# Patient Record
Sex: Female | Born: 1980 | Race: Black or African American | Hispanic: No | Marital: Married | Smoking: Never smoker
Health system: Southern US, Community
[De-identification: ages and names within clinical notes are randomized; demographics above are authoritative.]

## PROBLEM LIST (undated history)

## (undated) DIAGNOSIS — O24419 Gestational diabetes mellitus in pregnancy, unspecified control: Secondary | ICD-10-CM

## (undated) HISTORY — PX: ECTOPIC PREGNANCY SURGERY: SHX613

---

## 2015-09-07 HISTORY — PX: CERVICAL CERCLAGE: SHX1329

## 2015-09-07 NOTE — L&D Delivery Note (Addendum)
Delivery Note   I was called stat to the room that the patient had just delivered.  Upon my arrival RN reported that patient had gone to the bathroom, had had a bowel movement and soon after that fetus delivered.  RN rushed into bathroom to find fetus delivering in breech presentation with buttocks and trunk delivering and soon after extremities and head delivered.  RN reports that she supported the baby as it delivered, patient then got back to bed where cord was clamped and cut.   I arrived to find patient already on the bed, cord clamped and cut and pediatrics team attempting resuscitation.  Pediatric team reported that baby's airway was too small and they could not place any tubing down throat, baby did not respond to CPAP and therefore resuscitation efforts were stopped.    I delivered placenta, which appeared grossly normal and intact.  I then [facility] the [facility], no cervical trauma was noted.   Chaplain was present in room and offered prayers and support to patient and her family.        Baby delivered at 4:15 PM  APGAR: 1, 1; weight 1 lb 3 oz (539 g).  Baby expired after about 1 hr 50 minutes of life. Baby appeared with grossly normal external features.      Placenta status: Intact, Spontaneous.    Anesthesia: None  Episiotomy: None Lacerations: None Est. Blood Loss (mL): 100  Mom to postpartum.  [facility] [place]  [facility] 15-Nov-2015, 10:07 PM

## 2015-10-03 LAB — OB RESULTS CONSOLE RUBELLA ANTIBODY, IGM: Rubella: IMMUNE

## 2015-10-03 LAB — OB RESULTS CONSOLE HEPATITIS B SURFACE ANTIGEN: Hepatitis B Surface Ag: NEGATIVE

## 2015-10-03 LAB — OB RESULTS CONSOLE RPR: RPR: NONREACTIVE

## 2015-10-03 LAB — OB RESULTS CONSOLE HIV ANTIBODY (ROUTINE TESTING): HIV: NONREACTIVE

## 2015-12-22 ENCOUNTER — Inpatient Hospital Stay (HOSPITAL_COMMUNITY): Admission: AD | Admit: 2015-12-22 | Payer: Self-pay | Admitting: Obstetrics and Gynecology

## 2015-12-22 ENCOUNTER — Encounter (HOSPITAL_COMMUNITY): Payer: Self-pay | Admitting: *Deleted

## 2015-12-22 ENCOUNTER — Inpatient Hospital Stay (HOSPITAL_COMMUNITY)
Admission: AD | Admit: 2015-12-22 | Discharge: 2015-12-23 | DRG: 782 | Disposition: A | Discharge status: Other Acute Inpatient Hospital | Payer: Medicaid Other | Source: Ambulatory Visit | Attending: Obstetrics and Gynecology | Admitting: Obstetrics and Gynecology

## 2015-12-22 ENCOUNTER — Inpatient Hospital Stay (HOSPITAL_COMMUNITY): Payer: Medicaid Other

## 2015-12-22 DIAGNOSIS — O26879 Cervical shortening, unspecified trimester: Secondary | ICD-10-CM

## 2015-12-22 DIAGNOSIS — D563 Thalassemia minor: Secondary | ICD-10-CM | POA: Diagnosis present

## 2015-12-22 DIAGNOSIS — Z3A20 20 weeks gestation of pregnancy: Secondary | ICD-10-CM

## 2015-12-22 DIAGNOSIS — N883 Incompetence of cervix uteri: Secondary | ICD-10-CM | POA: Diagnosis present

## 2015-12-22 DIAGNOSIS — O3432 Maternal care for cervical incompetence, second trimester: Secondary | ICD-10-CM | POA: Diagnosis present

## 2015-12-22 LAB — CBC
HEMATOCRIT: 27.3 % — AB (ref 36.0–46.0)
Hemoglobin: 9.1 g/dL — ABNORMAL LOW (ref 12.0–15.0)
MCH: 22.2 pg — ABNORMAL LOW (ref 26.0–34.0)
MCHC: 33.3 g/dL (ref 30.0–36.0)
MCV: 66.7 fL — AB (ref 78.0–100.0)
PLATELETS: 199 10*3/uL (ref 150–400)
RBC: 4.09 MIL/uL (ref 3.87–5.11)
RDW: 16.3 % — AB (ref 11.5–15.5)
WBC: 9.9 10*3/uL (ref 4.0–10.5)

## 2015-12-22 LAB — ABO/RH: ABO/RH(D): B POS

## 2015-12-22 LAB — TYPE AND SCREEN
ABO/RH(D): B POS
ANTIBODY SCREEN: NEGATIVE

## 2015-12-22 MED ORDER — ACETAMINOPHEN 325 MG PO TABS: 650.0000 mg | ORAL_TABLET | ORAL | Status: DC | PRN

## 2015-12-22 MED ORDER — DOCUSATE SODIUM 100 MG PO CAPS: 100.0000 mg | ORAL_CAPSULE | Freq: Every day | ORAL | Status: DC

## 2015-12-22 MED ORDER — ZOLPIDEM TARTRATE 5 MG PO TABS: 5.0000 mg | ORAL_TABLET | Freq: Every evening | ORAL | Status: DC | PRN

## 2015-12-22 MED ORDER — PRENATAL MULTIVITAMIN CH: 1.0000 | ORAL_TABLET | Freq: Every day | ORAL | Status: DC

## 2015-12-22 MED ORDER — DEXTROSE IN LACTATED RINGERS 5 % IV SOLN
INTRAVENOUS | Status: DC
  Administered 2015-12-22 – 2015-12-23 (×2): via INTRAVENOUS

## 2015-12-22 MED ORDER — CALCIUM CARBONATE ANTACID 500 MG PO CHEW: 2.0000 | CHEWABLE_TABLET | ORAL | Status: DC | PRN

## 2015-12-22 NOTE — H&P (Signed)
Heather Little is a 35 y.o. female, G1 P0 at 20.1 weeks presents from the office per Dr Normand Sloopillard with cervical incompetence   Patient Active Problem List   Diagnosis Date Noted  . Cervical incompetence 12/22/2015    Pregnancy Course: Patient entered care at  8.5 weeks.   EDC of 05/09/16 established by LMP.      US evaluations:  13.2 weeks - 1st screen: Singleton pregnancy. IUP with cardiac activity seen. CRL measures 13w 2d. NT measures 1.384mm. NB seen. Rt ovary CL cyst/adnexas unremarkable. Lt ovary/adnexa unremarkable.  20.1 weeks - Anatomy: Singleton Pregnancy , Vertex Presentation, Anterior placenta- Placental edge is 5.5cm from internal OS-Normal. Fluid is Normal- vertical Pocket= 4.4cm. Ovaries and Adnexas appears within normal limits. Cervix in Funneled to the external os- there appears to be minimal cervix=0.18cm Profile, nasal bone, philtrum, palate, feet,orbits, open hands 5th digit, heels seen      Significant prenatal events:   beta thalassemia trait,  Cervix 1.8cm bulging Last evaluation:   20.1 weeks   VE:n/a  Reason for admission:  BULGING MEMBRANES  Pt States:   Contractions Frequency: none         Contraction severity: n/a         Fetal activity: +fm  OB History    Gravida Para Term Preterm AB TAB SAB Ectopic Multiple Living   5 1 1  0 3 2 0 1 0 1     History reviewed. No pertinent past medical history. History reviewed. No pertinent past surgical history. Family History: family history is not on file. Social History:  reports that she has never smoked. She does not have any smokeless tobacco history on file. Her alcohol and drug histories are not on file.   Prenatal Transfer Tool  Maternal Diabetes: No Genetic Screening: Normal Maternal Ultrasounds/Referrals: Abnormal:  Findings:   Other: bulging membrane Fetal Ultrasounds or other Referrals:  None, Referred to Materal Fetal Medicine  Maternal Substance Abuse:  No Significant Maternal Medications:   None Significant Maternal Lab Results: None   ROS:  See HPI above, all other systems are negative  No Known Allergies    Blood pressure 99/62, pulse 78, temperature 98.1 F (36.7 C), temperature source Oral, resp. rate 18, height 5\' 4"  (1.626 m), weight 173 lb (78.472 kg).  Maternal Exam:  Uterine Assessment: Contraction frequency is rare.  Abdomen: Gravid, non tender. Fundal height is aga.  Normal external genitalia, vulva, cervix, uterus and adnexa.  No lesions noted on exam.  Pelvis adequate for delivery.  Fetal presentation: Vertex by Leopold's   Fetal Exam:  Monitor Surveillance : NST q8 Mode: Ultrasound.  FHR 150 CTXs:none    Physical Exam: Nursing note and vitals reviewed General: alert and cooperative She appears well nourished Psychiatric: Normal mood and affect. Her behavior is normal Head: Normocephalic Eyes: Pupils are equal, round, and reactive to light Neck: Normal range of motion Cardiovascular: RRR without murmur  Respiratory: CTAB. Effort normal  Abd: soft, non-tender, +BS, no rebound, no guarding  Genitourinary: Vagina normal  Neurological: A&Ox3 Skin: Warm and dry  Musculoskeletal: Normal range of motion  Homan's sign negative bilaterally No evidence of DVTs.  Edema: no edema DTR: 2+ Clonus: None   Prenatal labs: ABO, Rh: --/--/B POS, B POS (04/17 1205) Antibody: NEG (04/17 1205) Rubella:  immune RPR:   NR HBsAg:   negative HIV:   NR GBS:  pending Sickle cell/Hgb electrophoresis:  WNL Pap:  ATYPICAL SQUAMOUS CELLS OF UNDETERMINED, 10/15/15 GC: negtive  Chlamydia: negative Genetic screenings:   wnl Glucola:  unknown  Assessment:  IUP at 20.1 weeks NICHD: Category Membranes: BBW GBS pending Diagnosis: incompetent cervix   Plan:  Admit to Antepartum unit Routing [facility] AP orders MFM consult in the morning T[place]elen[place]    Phoua Hoadley, CNM, MSN 12/22/2015, 5:33 PM

## 2015-12-22 NOTE — Progress Notes (Signed)
Patient ID: Benard Rinkenneh Oesterling, female   DOB: [birth year], 35 y.o.   MRN: [id number] Pt without complaints.  No leakage of fluid or VB.  Good FM  BP 110/54 mmHg  Pulse 72  Temp(Src) 98.2 F (36.8 C) (Oral)  Resp 20  Ht 5\' 4"  (1.626 m)  Wt 173 lb (78.472 kg)  BMI 29.68 kg/m2  FHTS Baseline: 156 bpm  Toco none  Pt in NAD CV RRR Lungs CTAB abd  Gravid soft and NT GU no vb.  SSE bulging membranes.   EXt no calf tenderness Results for orders placed or performed during the hospital encounter of 12/22/15 (from the past 72 hour(s))  Type and screen [facility]     Status: None   Collection Time: 12/22/15 12:05 PM  Result Value Ref Range   ABO/RH(D) B POS    Antibody Screen NEG    Sample Expiration 12/25/2015   ABO/Rh     Status: None   Collection Time: 12/22/15 12:05 PM  Result Value Ref Range   ABO/RH(D) B POS     Assessment and Plan 7024w1d  Advanced cervical dilation Admit and place in [place] MFM consult in am to see if she may be a candidate for emergency cerclage GBS done today in the office Check cbc Pt voiced understanding NPO at midnight

## 2015-12-22 NOTE — MAU Note (Signed)
Pt sent from [facility] for short cervix.  Denies pain, bleeding or discharge.

## 2015-12-23 ENCOUNTER — Inpatient Hospital Stay (HOSPITAL_COMMUNITY): Payer: Medicaid Other

## 2015-12-23 ENCOUNTER — Ambulatory Visit (HOSPITAL_COMMUNITY)
Admit: 2015-12-23 | Discharge: 2015-12-23 | Disposition: A | Discharge status: Home / Self Care | Payer: Medicaid Other | Attending: Certified Nurse Midwife | Admitting: Certified Nurse Midwife

## 2015-12-23 DIAGNOSIS — O3432 Maternal care for cervical incompetence, second trimester: Secondary | ICD-10-CM | POA: Insufficient documentation

## 2015-12-23 DIAGNOSIS — Z3A20 20 weeks gestation of pregnancy: Secondary | ICD-10-CM

## 2015-12-23 MED ORDER — INDOMETHACIN 50 MG PO CAPS
50.0000 mg | ORAL_CAPSULE | Freq: Once | ORAL | Status: AC
  Administered 2015-12-23: 50 mg via ORAL
  Filled 2015-12-23: qty 1

## 2015-12-23 MED ORDER — AZITHROMYCIN 500 MG PO TABS
1000.0000 mg | ORAL_TABLET | Freq: Once | ORAL | Status: AC
  Administered 2015-12-23: 1000 mg via ORAL
  Filled 2015-12-23: qty 2

## 2015-12-23 NOTE — Progress Notes (Addendum)
Called [facility].

## 2015-12-23 NOTE — Progress Notes (Addendum)
Report called to Rosaura CarpenterMissy Halliday, RN @[facility] , 4th floor, L&D for pt transfer.

## 2015-12-23 NOTE — Progress Notes (Signed)
Hospital day # 1 pregnancy at 2397w2d--cervical incompetence    S:  Perception of contractions: none      Vaginal bleeding: none now       Vaginal discharge:  no significant change  O: BP 94/80 mmHg  Pulse 73  Temp(Src) 98.2 F (36.8 C) (Oral)  Resp 16  Ht 5\' 4"  (1.626 m)  Wt 173 lb (78.472 kg)  BMI 29.68 kg/m2  SpO2 100%      Fetal tracings:158 via doppler      Contractions:  none       Uterus gravid and non-tender      Extremities: no significant edema and no signs of DVT          Labs:   Results for orders placed or performed during the hospital encounter of 12/22/15 (from the past 24 hour(s))  Type and screen [facility]     Status: None   Collection Time: 12/22/15 12:05 PM  Result Value Ref Range   ABO/RH(D) B POS    Antibody Screen NEG    Sample Expiration 12/25/2015   ABO/Rh     Status: None   Collection Time: 12/22/15 12:05 PM  Result Value Ref Range   ABO/RH(D) B POS   CBC     Status: Abnormal   Collection Time: 12/22/15  2:57 PM  Result Value Ref Range   WBC 9.9 4.0 - 10.5 K/uL   RBC 4.09 3.87 - 5.11 MIL/uL   Hemoglobin 9.1 (L) 12.0 - 15.0 g/dL   HCT 40.927.3 (L) 81.136.0 - 91.446.0 %   MCV 66.7 (L) 78.0 - 100.0 fL   MCH 22.2 (L) 26.0 - 34.0 pg   MCHC 33.3 30.0 - 36.0 g/dL   RDW 78.216.3 (H) 95.611.5 - 21.315.5 %   Platelets 199 150 - 400 K/uL         Meds:  Current facility-administered medications:  .  acetaminophen (TYLENOL) tablet 650 mg, 650 mg, Oral, Q4H PRN, Khadeem Rockett, CNM .  calcium carbonate (TUMS - dosed in mg elemental calcium) chewable tablet 400 mg of elemental calcium, 2 tablet, Oral, Q4H PRN, Analina Filla, CNM .  dextrose 5 % in lactated ringers infusion, , Intravenous, Continuous, Naima Dillard, MD, Last Rate: 125 mL/hr at 12/23/15 0800 .  docusate sodium (COLACE) capsule 100 mg, 100 mg, Oral, Daily, Katrisha Segall, CNM .  prenatal multivitamin tablet 1 tablet, 1 tablet, Oral, Q1200, Dinorah Masullo, CNM .  zolpidem (AMBIEN) tablet 5 mg, 5  mg, Oral, QHS PRN, Jkwon Treptow, CNM   A: 6697w2d with cervical incompetence     stable     Fetal tracings: appropriate for GA     Contractions: none     Uterus non-tender      Extremities: DTR 1+, no clonus, no edema     trendelnburg     Advanced cervical dilation    GBS done today in the office  P: Continue current plan of care      Upcoming tests/treatments:    MFM consult in am to see if she may be a candidate for emergency cerclage  NPO until MFM consult/cerclage placement      MDs will follow    Kentrel Clevenger, CNM, MSN 12/23/2015. 8:36 AM

## 2015-12-23 NOTE — Progress Notes (Signed)
Dr Estanislado Pandyivard & Dr Marjo Bickerenney notified of [facility] in transit [facility].

## 2015-12-23 NOTE — Consult Note (Signed)
MFM Staff Consult Note:  I reviewed with the patient the pathophysiology of preterm delivery in pregnancy and that often the etiology of the preterm delivery is not ascertained from the clinical scenario. In this patient's case, she has not had a history of preterm delivery or second trimester loss.  However, she is 20 weeks 2 days and has a dilated cervix with bulging membranes.    By my exam her cervix is almost 2 cm visually dilated and 2-3/70/high with a ballotable fetal foot.  She is not contracting, has no fundal tenderness, no foul smelling discharge, has intact membranes and is afebrile.  Clearly, this pregnancy is complicated by apparently insufficiency in her cervix to continue to viable gestational age.  Hence, my impression is that of cervical insufficiency.   Advanced cervical dilation can also lead to infection; therefore, there are 2 mechanisms that could be contributing to the preterm delivery.  I did remind the patient that there is no treatment for preterm labor at this early gestational age, but there are appropriate medical interventions to increase the success rate of rescue cerclage.  Consistent with recently published protocols (eg, Berghella et al), I recommend initiation of indomethacin x 48 hours along with latency antibiotics x 48 hours to most fully reduce risk of contraction onset and membrane rupture.  Rescue cerclage for a woman without a history of preterm birth with sonographic demonstration of dilated cervix (no measurable functional cervical length but less than 3cm dilated) along with digital examination demonstrating 2-3 cm of dilation of the internal os in this current pregnancy is justified by current literature. There is evidence that these women may gain an additional 6 weeks of gestation as opposed to no rescue cerclage.    I explained risks of pprom, bleeding, infection, injury to surrounding structures, and spontaneous preterm delivery in spite of the attempt.   She understands a 3-5% risk of infection and/or pprom in setting of cerclage placed as a rescue.  Given no evidence of infection or PTL has been seen over an extended observation period thusfar, she is an excellent candidate for rescue cerclage.  She desired the procedure, understood the rationale behind the approach, and desired [facility] to facilitate the needed course of action under our service for the duration of the surgery and 48 hour observation period.  Summary: I explained to the patient my diagnosis of cervical insufficiency as based upon ultrasound and examination. She understands the impression and implications with risk of previable pregnancy loss. I then discussed available treatments including vaginal progesterone and cerclage. The patient understood purpose, expected outcomes, risks, and benefits germane to each. She decided she was interested in cerclage and 48 hour Indocin/latency antibiotics under our service.  Recommendations: 1. I spoke with Drs. Rivard and Vincenza HewsQuinn who agree 2. maintain uterine monitoring [facility]. [facility] 4. Indocin 50mg  load then 25 mg po q6 x 48 hours (start now)  5. Begin Ampicillin/azithromycin for latency antibiotics x 48 hours  6. Following cerclage I recommend a TVUS for "stitch check" and cervical length in 1 week and then every 2 weeks there after until 28 weeks 7. Following cerclage, prometrium 200mg  pv qhs until 36 weeks 8. Cerclage removal if evidence infection, labor or achievement of 36 weeks (whichever comes first)   Time Spent: I spent in excess of 60 minutes in consultation with this patient to review records, evaluate her case, and provide her with an  adequate discussion and education. More than 50% of this time was spent in direct face-to-face counseling.   It was a pleasure seeing your patient in the hospital today. Thank you for consultation. Please do  not hesitate to contact our service for any further questions.  Thank you,  Denney, Louann Sjogren, MD, MS, FACOG Assistant Professor Section of Maternal-Fetal Medicine [facility]

## 2015-12-23 NOTE — Progress Notes (Addendum)
Heather CarpenterMissy Halliday, RN updated on new oral meds given: Indocin 50mg  PO & Zithromax 1000mg  PO.

## 2015-12-23 NOTE — Discharge Summary (Signed)
Transfer of Care   Transfer of Care to MFM  Patient ID: Heather Little MRN: [id number] DOB/AGE: 34-Jan-1982 34 y.o.  Admit date: 12/22/2015 Discharge date: 12/23/2015  Admission Diagnoses: cervical incompentence  Discharge Diagnoses:  Active Problems:   Cervical incompetence   Discharged Condition: good  Hospital Course: BMZ  PreNatal Labs ABO, Rh: --/--/B POS, B POS (04/17 1205)   Antibody: NEG (04/17 1205) RPR:   NR HBsAg:   negative HIV:   NR GBS:  pending Sickle cell/Hgb electrophoresis: WNL Pap: ATYPICAL SQUAMOUS CELLS OF UNDETERMINED, 10/15/15 GC: negtive  Chlamydia: negative Genetic screenings: wnl Glucola: unknown    Consults: MFM  Significant Diagnostic Studies: US for cervical length  Treatments: [place]   Discharge Exam: Blood pressure 94/80, pulse 73, temperature 98.2 F (36.8 C), temperature source Oral, resp. rate 16, height 5\' 4"  (1.626 m), weight 173 lb (78.472 kg), SpO2 100 %. General appearance: alert, cooperative and appears stated age  Disposition: transfer of care to MFM for cerclage placement     Medication List    ASK your doctor about these medications        prenatal multivitamin Tabs tablet  Take 1 tablet by mouth daily at 12 noon.      azithromycin 1g PO now then Amp 2g IV q6 Indomethacin 50mg  PO now then 25mg  PO q6   Signed: Jace Dowe, CNM, MSN 12/23/2015, 9:14 AM

## 2015-12-23 NOTE — Progress Notes (Addendum)
Report given by phone to Valene BorsJosh Rife, paramedic with [facility]

## 2015-12-23 NOTE — Addendum Note (Signed)
Encounter addended by: Durwin NoraJeffrey M Nayana Lenig, MD on: 12/23/2015 11:42 AM<BR>     Documentation filed: Charges VN

## 2015-12-26 ENCOUNTER — Inpatient Hospital Stay (HOSPITAL_COMMUNITY)
Admission: AD | Admit: 2015-12-26 | Discharge: 2015-12-26 | Disposition: A | Discharge status: Home / Self Care | Payer: Medicaid Other | Source: Ambulatory Visit | Attending: Obstetrics and Gynecology | Admitting: Obstetrics and Gynecology

## 2015-12-26 ENCOUNTER — Inpatient Hospital Stay (HOSPITAL_COMMUNITY): Payer: Medicaid Other

## 2015-12-26 DIAGNOSIS — N883 Incompetence of cervix uteri: Secondary | ICD-10-CM | POA: Insufficient documentation

## 2015-12-26 DIAGNOSIS — Z3A20 20 weeks gestation of pregnancy: Secondary | ICD-10-CM | POA: Diagnosis not present

## 2015-12-26 DIAGNOSIS — O4290 Premature rupture of membranes, unspecified as to length of time between rupture and onset of labor, unspecified weeks of gestation: Secondary | ICD-10-CM

## 2015-12-26 DIAGNOSIS — N898 Other specified noninflammatory disorders of vagina: Secondary | ICD-10-CM | POA: Diagnosis present

## 2015-12-26 DIAGNOSIS — O26892 Other specified pregnancy related conditions, second trimester: Secondary | ICD-10-CM | POA: Diagnosis not present

## 2015-12-26 DIAGNOSIS — O42919 Preterm premature rupture of membranes, unspecified as to length of time between rupture and onset of labor, unspecified trimester: Secondary | ICD-10-CM

## 2015-12-26 DIAGNOSIS — O3432 Maternal care for cervical incompetence, second trimester: Secondary | ICD-10-CM

## 2015-12-26 LAB — AMNISURE RUPTURE OF MEMBRANE (ROM) NOT AT ARMC: Amnisure ROM: NEGATIVE

## 2015-12-26 LAB — URINE MICROSCOPIC-ADD ON

## 2015-12-26 LAB — URINALYSIS, ROUTINE W REFLEX MICROSCOPIC
BILIRUBIN URINE: NEGATIVE
GLUCOSE, UA: NEGATIVE mg/dL
KETONES UR: NEGATIVE mg/dL
Leukocytes, UA: NEGATIVE
Nitrite: NEGATIVE
PROTEIN: NEGATIVE mg/dL
Specific Gravity, Urine: 1.02 (ref 1.005–1.030)
pH: 6 (ref 5.0–8.0)

## 2015-12-26 NOTE — Discharge Instructions (Signed)
Premature Rupture and Preterm Premature Rupture of Membranes °Premature rupture of membranes (PROM) is when the membranes (amniotic sac) break open before contractions or labor starts. Rupture of membranes is commonly referred to as your water breaking. If PROM occurs before 37 weeks of pregnancy, it is called preterm premature rupture of membranes (PPROM). The amniotic sac holds the fetus, keeps infection out, and performs other important functions. Having the amniotic sac rupture before 37 weeks of pregnancy can lead to serious problems and requires immediate attention by your health care provider. °CAUSES  °PROM near the end of the pregnancy may be caused by natural weakening of the membranes. PPROM is often due to an infection. Other factors that may be associated with PROM include: °· Stretching of the amniotic sac because of carrying multiples or having too much amniotic fluid. °· Trauma. °· Smoking during pregnancy. °· Poor nutrition. °· Previous preterm birth. °· Vaginal bleeding. °· Little to no prenatal care. °· Problems with the placenta, such as placenta previa or placental abruption. °RISKS OF PROM AND PPROM °· Delivering a premature baby. °· Getting a serious infection of the placental tissues (chorioamnionitis). °· Early detachment of the placenta from the uterus (placental abruption). °· Compression of the umbilical cord. °· Needing a cesarean birth. °· Developing a serious infection after delivery. °SIGNS OF PROM OR PPROM  °· A sudden gush or slow leaking of fluid from the vagina. °· Constant wet underwear. °Sometimes, women mistake the leaking or wetness for urine, especially if the leak is slow and not a gush of fluid. If there is constant leaking or your underwear continues to get wet, your membranes have likely ruptured. °WHAT TO DO IF YOU THINK YOUR MEMBRANES HAVE RUPTURED °Call your health care provider right away. You will need to go to the hospital to get checked immediately. °WHAT HAPPENS  IF YOU ARE DIAGNOSED WITH PROM OR PPROM? °Once you arrive at the hospital, you will have tests done. A cervical exam will be performed to check if the cervix has softened or started to open (dilate). If you are diagnosed with PROM, you may be induced within 24 hours if you are not having contractions. If you are diagnosed with PPROM and are not having contractions, you may be induced depending on your trimester.  °If you have PPROM, you: °· And your baby will be monitored closely for signs of infection or other complications. °· May be given an antibiotic medicine to lower the chances of an infection developing. °· May be given a steroid medicine to help mature the baby's lungs faster. °· May be given a medicine to stop preterm labor. °· May be ordered to be on bed rest at home or in the hospital. °· May be induced if complications arise for you or the baby. °Your treatment will depend on many factors, such as how far along you are, the development of the baby, and other complications that may arise. °  °This information is not intended to replace advice given to you by your health care provider. Make sure you discuss any questions you have with your health care provider. °  °Document Released: 08/23/2005 Document Revised: 06/13/2013 Document Reviewed: 12/12/2012 °Elsevier Interactive Patient Education ©2016 [employer] ° °

## 2015-12-26 NOTE — MAU Note (Signed)
Woke up at 0430, soaked. Cleaned up- went back to bed, soaked again. Clear fluid, no bleeding. Little cramping

## 2015-12-26 NOTE — MAU Provider Note (Signed)
Heather Little, Heather Little is a 35 yo, G5P1031, at 20.[redacted] wks ega presenting to MAU announced with complaints of  Leaking of fluid. She reports waking up at 0430 with panties soaked. She reports getting cleaned up, going back to bed and feeling soaked again.  Fluid reported as clear. No bleeding noted, Little to no cramping.   Pt was recently hospitalized on 12/22/15 for cervical incompetence with bulging membranes and received rescue cerclage 12/23/15.   History     Patient Active Problem List   Diagnosis Date Noted  . Cervical incompetence 12/22/2015    Chief Complaint  Patient presents with  . Vaginal Discharge   HPI  OB History    Gravida Para Term Preterm AB TAB SAB Ectopic Multiple Living   0 3 2 0 1 0 1      No past medical history on file.  No past surgical history on file.  No family history on file.  Social History  Substance Use Topics  . Smoking status: Never Smoker   . Smokeless tobacco: Not on file  . Alcohol Use: Not on file    Allergies: No Known Allergies  Prescriptions prior to admission  Medication Sig Dispense Refill Last Dose  . indomethacin (INDOCIN) 25 MG capsule Take 25 mg by mouth every 6 (six) hours.   12/25/2015 at Unknown time  . Prenatal Vit-Fe Fumarate-FA (PRENATAL MULTIVITAMIN) TABS tablet Take 1 tablet by mouth daily at 12 noon.   12/25/2015 at Unknown time  . progesterone (PROMETRIUM) 200 MG capsule Place 200 mg vaginally daily.   12/25/2015 at Unknown time    ROS Physical Exam   Blood pressure 110/62, pulse 86, temperature 98.6 F (37 C), temperature source Oral, resp. rate 18, weight 77.293 kg (170 lb 6.4 oz).  Results for orders placed or performed during the hospital encounter of 12/26/15 (from the past 24 hour(s))  Urinalysis, Routine w reflex microscopic (not at [facility])     Status: Abnormal   Collection Time: 12/26/15 10:08 AM  Result Value Ref Range   Color, Urine YELLOW YELLOW   APPearance CLEAR CLEAR   Specific Gravity, Urine 1.020  1.005 - 1.030   pH 6.0 5.0 - 8.0   Glucose, UA NEGATIVE NEGATIVE mg/dL   Hgb urine dipstick TRACE (A) NEGATIVE   Bilirubin Urine NEGATIVE NEGATIVE   Ketones, ur NEGATIVE NEGATIVE mg/dL   Protein, ur NEGATIVE NEGATIVE mg/dL   Nitrite NEGATIVE NEGATIVE   Leukocytes, UA NEGATIVE NEGATIVE  Urine microscopic-add on     Status: Abnormal   Collection Time: 12/26/15 10:08 AM  Result Value Ref Range   Squamous Epithelial / LPF 0-5 (A) NONE SEEN   WBC, UA 0-5 0 - 5 WBC/hpf   RBC / HPF 0-5 0 - 5 RBC/hpf   Bacteria, UA FEW (A) NONE SEEN   Urine-Other MUCOUS PRESENT   Amnisure rupture of membrane (rom)not at [facility]     Status: None   Collection Time: 12/26/15 10:52 AM  Result Value Ref Range   Amnisure ROM NEGATIVE    [place]:  FHR 159 bpm, Presentation transverse, head to maternal right, placenta anterior above cervical os, AFI subjective within normal limits at 3.8cm. Cervix apprears to be funnelled, cerclage suture present.   VE:  Pooling of fluid/mucous in vagina          Cerclage present, cervix closed   Physical Exam  Constitutional: She is oriented to person, place, and time. She appears well-developed and well-nourished.  HENT:  Head:  Normocephalic.  Eyes: Pupils are equal, round, and reactive to light.  Neck: Normal range of motion.  Cardiovascular: Normal rate.   Respiratory: Effort normal.  GI: Soft.  Genitourinary:  copius watery mucous in vaginal vault  Musculoskeletal: Normal range of motion.  Neurological: She is alert and oriented to person, place, and time. She has normal reflexes.  Skin: Skin is warm and dry.  Psychiatric: She has a normal mood and affect. Her behavior is normal. Judgment and thought content normal.    ED Course  Assessment: IUP 20.5 wks Postive pooling  Amniosure Negative AFI subjectively wnl Transverse fetal lie, fetal head to maternal right No contractions FHR  159 bpm  Plan: Consult with Dr. Estanislado Pandyivard Dc home today Pt to return on  12/28/15 for  repeat amniosure in MAU If repeat amniosure is  negative , F/u with scheduled MFM appt on Tuesday 12/30/15 If repeat amniosure is positivepositive, Admit to antenatal for 48hrs of latency antibiotics and Nicu consult Call PRN   Alphonzo Severanceachel Kimmy Totten CNM, MSN 12/26/2015 12:47 PM

## 2015-12-28 ENCOUNTER — Inpatient Hospital Stay (HOSPITAL_COMMUNITY)
Admission: AD | Admit: 2015-12-28 | Discharge: 2015-12-28 | Disposition: A | Discharge status: Home / Self Care | Payer: Medicaid Other | Source: Ambulatory Visit | Attending: Obstetrics and Gynecology | Admitting: Obstetrics and Gynecology

## 2015-12-28 ENCOUNTER — Encounter (HOSPITAL_COMMUNITY): Payer: Self-pay | Admitting: *Deleted

## 2015-12-28 DIAGNOSIS — O26892 Other specified pregnancy related conditions, second trimester: Secondary | ICD-10-CM

## 2015-12-28 DIAGNOSIS — N898 Other specified noninflammatory disorders of vagina: Secondary | ICD-10-CM

## 2015-12-28 DIAGNOSIS — D563 Thalassemia minor: Secondary | ICD-10-CM | POA: Diagnosis present

## 2015-12-28 DIAGNOSIS — Z3A21 21 weeks gestation of pregnancy: Secondary | ICD-10-CM | POA: Diagnosis not present

## 2015-12-28 DIAGNOSIS — O3432 Maternal care for cervical incompetence, second trimester: Secondary | ICD-10-CM | POA: Diagnosis present

## 2015-12-28 LAB — URINALYSIS, ROUTINE W REFLEX MICROSCOPIC
Bilirubin Urine: NEGATIVE
GLUCOSE, UA: NEGATIVE mg/dL
KETONES UR: NEGATIVE mg/dL
NITRITE: NEGATIVE
PH: 6 (ref 5.0–8.0)
Protein, ur: NEGATIVE mg/dL
SPECIFIC GRAVITY, URINE: 1.02 (ref 1.005–1.030)

## 2015-12-28 LAB — URINE MICROSCOPIC-ADD ON

## 2015-12-28 LAB — AMNISURE RUPTURE OF MEMBRANE (ROM) NOT AT ARMC: Amnisure ROM: NEGATIVE

## 2015-12-28 NOTE — MAU Provider Note (Signed)
Heather Little is a 34yo, [id number], at [redacted] wks ega presenting to MAU announced for F/u amniosure  From a 12/26/15 MAU visit.  Denies lof or VB.   Pt was recently hospitalized on 12/22/15 for cervical incompetence with blging membranes and received a rescue cerclage 12/23/15.   Pt was seen in MAU on 12/26/15 for complaints of leaking of fluid. She was evaluated via sterile speculum  And amniosure.  Speculum exam noted  Clear watery, mucousy, discharge. Amniosure was negative.   SVE: closed with cerclage in place Per Consult with Cloretta NedS. Rivard, MD and MD to MD consult with  Harlon FlorWhitaker, MD, it was determined to bring pt back to MAU 12/28/15 (today) for repeat Amniosure.   If repeat Amniosure is negative, pt will F/U with MFM and go to previously scheduled MFM  Visit On 12/30/15.     History     Patient Active Problem List   Diagnosis Date Noted  . Beta thalassemia trait 12/28/2015  . Cervical cerclage suture present in second trimester 12/28/2015  . Vaginal discharge during pregnancy in second trimester 12/26/2015  . Cervical incompetence 12/22/2015    Chief Complaint  Patient presents with  . Vaginal Discharge   HPI  OB History    Gravida Para Term Preterm AB TAB SAB Ectopic Multiple Living   5 1 1  0 3 2 0 1 0 1      History reviewed. No pertinent past medical history.  Past Surgical History  Procedure Laterality Date  . Ectopic pregnancy surgery      No family history on file.  Social History  Substance Use Topics  . Smoking status: Never Smoker   . Smokeless tobacco: None  . Alcohol Use: No    Allergies: No Known Allergies  Prescriptions prior to admission  Medication Sig Dispense Refill Last Dose  . Prenatal Vit-Fe Fumarate-FA (PRENATAL MULTIVITAMIN) TABS tablet Take 1 tablet by mouth daily at 12 noon.   12/27/2015 at Unknown time  . progesterone (PROMETRIUM) 200 MG capsule Place 200 mg vaginally daily.   12/27/2015 at Unknown time    ROS Physical Exam   Blood pressure 112/54,  pulse 79, temperature 98.1 F (36.7 C), temperature source Oral, resp. rate 18, height 5\' 4"  (1.626 m), weight 78.019 kg (172 lb).  Filed Vitals:   12/28/15 1117 12/28/15 1215  BP: 112/54 102/53  Pulse: 79   Temp: 98.1 F (36.7 C)   TempSrc: Oral   Resp: 18   Height: 5\' 4"  (1.626 m)   Weight: 78.019 kg (172 lb)     Results for orders placed or performed during the hospital encounter of 12/28/15 (from the past 72 hour(s))  Urinalysis, Routine w reflex microscopic (not at [facility])     Status: Abnormal   Collection Time: 12/28/15 11:09 AM  Result Value Ref Range   Color, Urine YELLOW YELLOW   APPearance CLEAR CLEAR   Specific Gravity, Urine 1.020 1.005 - 1.030   pH 6.0 5.0 - 8.0   Glucose, UA NEGATIVE NEGATIVE mg/dL   Hgb urine dipstick TRACE (A) NEGATIVE   Bilirubin Urine NEGATIVE NEGATIVE   Ketones, ur NEGATIVE NEGATIVE mg/dL   Protein, ur NEGATIVE NEGATIVE mg/dL   Nitrite NEGATIVE NEGATIVE   Leukocytes, UA TRACE (A) NEGATIVE  Urine microscopic-add on     Status: Abnormal   Collection Time: 12/28/15 11:09 AM  Result Value Ref Range   Squamous Epithelial / LPF 0-5 (A) NONE SEEN   WBC, UA 0-5 0 - 5 WBC/hpf  RBC / HPF 0-5 0 - 5 RBC/hpf   Bacteria, UA RARE (A) NONE SEEN   Urine-Other MUCOUS PRESENT   Amnisure rupture of membrane (rom)not at [facility]     Status: None   Collection Time: 12/28/15 11:38 AM  Result Value Ref Range   Amnisure ROM NEGATIVE    FHR:   150 bpm, moderate variability, +accels, occasional variable decels U C; none SVe: deferred    Physical Exam  Constitutional: She is oriented to person, place, and time. She appears well-developed and well-nourished.  HENT:  Head: Normocephalic.  Eyes: Pupils are equal, round, and reactive to light.  Neck: Normal range of motion.  Cardiovascular: Normal rate and regular rhythm.   Respiratory: Effort normal.  GI: Soft.  Musculoskeletal: Normal range of motion.  Neurological: She is alert and oriented to person,  place, and time. She has normal reflexes.  Skin: Skin is warm and dry.  Psychiatric: She has a normal mood and affect. Her behavior is normal. Judgment and thought content normal.    ED Course  Assessment: Iup 21.0 wks Amniosure negative  Plan: DC home Go to scheduled MFM OB visit on 12/30/15 Dr. Su Hilt, MD notified of negative Amniosure results  Call PRN  Alphonzo Severance CNM, MSN 12/28/2015 12:14 PM

## 2015-12-28 NOTE — MAU Note (Signed)
Doctor sent me here for swabs

## 2015-12-28 NOTE — Discharge Instructions (Signed)
Cervical Cerclage, Care After Refer to this sheet in the next few weeks. These instructions provide you with information on caring for yourself after your procedure. Your health care provider may also give you more specific instructions. Your treatment has been planned according to current medical practices, but problems sometimes occur. Call your health care provider if you have any problems or questions after your procedure. WHAT TO EXPECT AFTER THE PROCEDURE  After your procedure, it is typical to have the following:  Abdominal cramping.  Vaginal spotting. HOME CARE INSTRUCTIONS   Only take over-the-counter or prescription medicines for pain, discomfort, or fever as directed by your health care provider.  Avoid physical activities and exercise until your health care provider says it is okay.  Do not douche or have sexual intercourse until your health care provider tells you it is okay.  Keep your follow-up surgical and prenatal appointments with your health care provider. SEEK MEDICAL CARE IF:   You have abnormal vaginal discharge.  You have a rash.  You become lightheaded or feel faint.  You have abdominal pain that is not controlled with pain medicine. SEEK IMMEDIATE MEDICAL CARE IF:   You develop vaginal bleeding.  You are leaking fluid or have a gush of fluid from the vagina.  You have a fever.  You faint.  You have uterine contractions.  You feel your baby is not moving as much as usual, or you cannot feel your baby move.  You have chest pain or shortness of breath.   This information is not intended to replace advice given to you by your health care provider. Make sure you discuss any questions you have with your health care provider.   Document Released: 06/13/2013 Document Revised: 08/28/2013 Document Reviewed: 06/13/2013 Elsevier Interactive Patient Education 2016 [employer]. Second Trimester of Pregnancy The second trimester is from  week 13 through week 28, months 4 through 6. The second trimester is often a time when you feel your best. Your body has also adjusted to being pregnant, and you begin to feel better physically. Usually, morning sickness has lessened or quit completely, you may have more energy, and you may have an increase in appetite. The second trimester is also a time when the fetus is growing rapidly. At the end of the sixth month, the fetus is about 9 inches long and weighs about 1 pounds. You will likely begin to feel the baby move (quickening) between 18 and 20 weeks of the pregnancy. BODY CHANGES Your body goes through many changes during pregnancy. The changes vary from woman to woman.   Your weight will continue to increase. You will notice your lower abdomen bulging out.  You may begin to get stretch marks on your hips, abdomen, and breasts.  You may develop headaches that can be relieved by medicines approved by your health care provider.  You may urinate more often because the fetus is pressing on your bladder.  You may develop or continue to have heartburn as a result of your pregnancy.  You may develop constipation because certain hormones are causing the muscles that push waste through your intestines to slow down.  You may develop hemorrhoids or swollen, bulging veins (varicose veins).  You may have back pain because of the weight gain and pregnancy hormones relaxing your joints between the bones in your pelvis and as a result of a shift in weight and the muscles that support your balance.  Your breasts  will continue to grow and be tender.  Your gums may bleed and may be sensitive to brushing and flossing.  Dark spots or blotches (chloasma, mask of pregnancy) may develop on your face. This will likely fade after the baby is born.  A dark line from your belly button to the pubic area (linea nigra) may appear. This will likely fade after the baby is born.  You may have changes in your  hair. These can include thickening of your hair, rapid growth, and changes in texture. Some women also have hair loss during or after pregnancy, or hair that feels dry or thin. Your hair will most likely return to normal after your baby is born. WHAT TO EXPECT AT YOUR PRENATAL VISITS During a routine prenatal visit:  You will be weighed to make sure you and the fetus are growing normally.  Your blood pressure will be taken.  Your abdomen will be measured to track your baby's growth.  The fetal heartbeat will be listened to.  Any test results from the previous visit will be discussed. Your health care provider may ask you:  How you are feeling.  If you are feeling the baby move.  If you have had any abnormal symptoms, such as leaking fluid, bleeding, severe headaches, or abdominal cramping.  If you are using any tobacco products, including cigarettes, chewing tobacco, and electronic cigarettes.  If you have any questions. Other tests that may be performed during your second trimester include:  Blood tests that check for:  Low iron levels (anemia).  Gestational diabetes (between 24 and 28 weeks).  Rh antibodies.  Urine tests to check for infections, diabetes, or protein in the urine.  An ultrasound to confirm the proper growth and development of the baby.  An amniocentesis to check for possible genetic problems.  Fetal screens for spina bifida and Down syndrome.  HIV (human immunodeficiency virus) testing. Routine prenatal testing includes screening for HIV, unless you choose not to have this test. HOME CARE INSTRUCTIONS   Avoid all smoking, herbs, alcohol, and unprescribed drugs. These chemicals affect the formation and growth of the baby.  Do not use any tobacco products, including cigarettes, chewing tobacco, and electronic cigarettes. If you need help quitting, ask your health care provider. You may receive counseling support and other resources to help you  quit.  Follow your health care provider's instructions regarding medicine use. There are medicines that are either safe or unsafe to take during pregnancy.  Exercise only as directed by your health care provider. Experiencing uterine cramps is a good sign to stop exercising.  Continue to eat regular, healthy meals.  Wear a good support bra for breast tenderness.  Do not use hot tubs, steam rooms, or saunas.  Wear your seat belt at all times when driving.  Avoid raw meat, uncooked cheese, cat litter boxes, and soil used by cats. These carry germs that can cause birth defects in the baby.  Take your prenatal vitamins.  Take 1500-2000 mg of calcium daily starting at the 20th week of pregnancy until you deliver your baby.  Try taking a stool softener (if your health care provider approves) if you develop constipation. Eat more high-fiber foods, such as fresh vegetables or fruit and whole grains. Drink plenty of fluids to keep your urine clear or pale yellow.  Take warm sitz baths to soothe any pain or discomfort caused by hemorrhoids. Use hemorrhoid cream if your health care provider approves.  If you develop varicose veins, wear  support hose. Elevate your feet for 15 minutes, 3-4 times a day. Limit salt in your diet.  Avoid heavy lifting, wear low heel shoes, and practice good posture.  Rest with your legs elevated if you have leg cramps or low back pain.  Visit your dentist if you have not gone yet during your pregnancy. Use a soft toothbrush to brush your teeth and be gentle when you floss.  A sexual relationship may be continued unless your health care provider directs you otherwise.  Continue to go to all your prenatal visits as directed by your health care provider. SEEK MEDICAL CARE IF:   You have dizziness.  You have mild pelvic cramps, pelvic pressure, or nagging pain in the abdominal area.  You have persistent nausea, vomiting, or diarrhea.  You have a bad smelling  vaginal discharge.  You have pain with urination. SEEK IMMEDIATE MEDICAL CARE IF:   You have a fever.  You are leaking fluid from your vagina.  You have spotting or bleeding from your vagina.  You have severe abdominal cramping or pain.  You have rapid weight gain or loss.  You have shortness of breath with chest pain.  You notice sudden or extreme swelling of your face, hands, ankles, feet, or legs.  You have not felt your baby move in over an hour.  You have severe headaches that do not go away with medicine.  You have vision changes.   This information is not intended to replace advice given to you by your health care provider. Make sure you discuss any questions you have with your health care provider.   Document Released: 08/17/2001 Document Revised: 09/13/2014 Document Reviewed: 10/24/2012 Elsevier Interactive Patient Education [employer]2016 Elsevier Inc.

## 2015-12-29 ENCOUNTER — Other Ambulatory Visit (HOSPITAL_COMMUNITY): Payer: Self-pay | Admitting: Obstetrics and Gynecology

## 2015-12-30 ENCOUNTER — Other Ambulatory Visit (HOSPITAL_COMMUNITY): Payer: Self-pay | Admitting: Maternal and Fetal Medicine

## 2015-12-30 ENCOUNTER — Inpatient Hospital Stay (HOSPITAL_COMMUNITY)
Admission: AD | Admit: 2015-12-30 | Discharge: 2015-12-30 | Disposition: A | Discharge status: Home / Self Care | Payer: Medicaid Other | Source: Ambulatory Visit | Attending: Obstetrics & Gynecology | Admitting: Obstetrics & Gynecology

## 2015-12-30 ENCOUNTER — Encounter (HOSPITAL_COMMUNITY): Payer: Self-pay

## 2015-12-30 ENCOUNTER — Ambulatory Visit (HOSPITAL_COMMUNITY)
Admission: RE | Admit: 2015-12-30 | Discharge: 2015-12-30 | Disposition: A | Discharge status: Home / Self Care | Payer: Medicaid Other | Source: Ambulatory Visit | Attending: Obstetrics and Gynecology | Admitting: Obstetrics and Gynecology

## 2015-12-30 ENCOUNTER — Encounter (HOSPITAL_COMMUNITY): Payer: Self-pay | Admitting: *Deleted

## 2015-12-30 DIAGNOSIS — Z3A21 21 weeks gestation of pregnancy: Secondary | ICD-10-CM | POA: Insufficient documentation

## 2015-12-30 DIAGNOSIS — O3432 Maternal care for cervical incompetence, second trimester: Secondary | ICD-10-CM

## 2015-12-30 DIAGNOSIS — O42912 Preterm premature rupture of membranes, unspecified as to length of time between rupture and onset of labor, second trimester: Secondary | ICD-10-CM | POA: Insufficient documentation

## 2015-12-30 DIAGNOSIS — O418X20 Other specified disorders of amniotic fluid and membranes, second trimester, not applicable or unspecified: Secondary | ICD-10-CM | POA: Insufficient documentation

## 2015-12-30 LAB — AMNISURE RUPTURE OF MEMBRANE (ROM) NOT AT ARMC: AMNISURE: NEGATIVE

## 2015-12-30 NOTE — MAU Note (Addendum)
Brought to MAU from U/S with dx of low amniotic fluid. Denies leakage of fluid or vaginal D/C of any kind. States she has had an exam (U/S). Wants to know why she is here and how long is this going to take. Informed patient the CNM will be in to speak with her regarding her plan of care.

## 2015-12-30 NOTE — ED Notes (Signed)
[facility]  Pt to MAU.

## 2015-12-31 NOTE — MAU Provider Note (Signed)
Heather Little is a 34yo, [id number], at 21 wks 2 days ega who had an ultrasound by MFM today and ultrasound showed no amniotic fluid.  Her recent prenatal course is significant for history of cervical incompetence with bulging membranes and received a rescue cerclage 12/23/15. Pt was seen in MAU on 12/26/15 for complaints of leaking of fluid. She was evaluated via sterile speculum And amnisure. Speculum exam noted Clear watery, mucousy, discharge. Amniosure was negative. SVE: closed with cerclage in place Per Consult with Cloretta Ned, MD and MD to MD consult with Harlon Flor, MD, it was determined to bring pt back to MAU 12/28/15 for repeat Amnisure which was negative again. She then followed up with MFM today on 12/30/15 where she was found to have no fluid seen on ultrasound.  Repeat Amnisure at the MAU today was negative.   Patient denies any recent leakage of fluid (none today). She denies abdominal pain or contractions or vaginal bleeding.  With gross fetal movement.    History     Patient Active Problem List   Diagnosis Date Noted  . Beta thalassemia trait 12/28/2015  . Cervical cerclage suture present in second trimester 12/28/2015  . Vaginal discharge during pregnancy in second trimester 12/26/2015  . Cervical incompetence 12/22/2015    Chief Complaint  Patient presents with  . Vaginal Discharge   HPI  OB History    Gravida Para Term Preterm AB TAB SAB Ectopic Multiple Living   0 3 2 0 1 0 1      History reviewed. No pertinent past medical history.  Past Surgical History  Procedure Laterality Date  . Ectopic pregnancy surgery      No family history on file.  Social History  Substance Use Topics  . Smoking status: Never Smoker   . Smokeless tobacco: None  . Alcohol Use: No    Allergies: No Known Allergies  Prescriptions prior to admission  Medication Sig Dispense Refill  Last Dose  . Prenatal Vit-Fe Fumarate-FA (PRENATAL MULTIVITAMIN) TABS tablet Take 1 tablet by mouth daily at 12 noon.   12/27/2015 at Unknown time  . progesterone (PROMETRIUM) 200 MG capsule Place 200 mg vaginally daily.   12/27/2015 at Unknown time    ROS Physical Exam   Filed Vitals:   12/30/15 1605 12/30/15 1706  BP: 117/59 110/58  Pulse: 75 71  Temp: 97.4 F (36.3 C)   TempSrc: Oral   Resp: 18 16                                                Physical Exam  Constitutional: She is oriented to person, place, and time. She appears well-developed and well-nourished.  HENT:  Head: Normocephalic.  Eyes: Pupils are equal, round, and reactive to light.  Neurological: She is alert and oriented to person, place, and time. She has normal reflexes.  Skin: Skin is warm and dry.  Psychiatric: She has a normal mood and affect. Her behavior is normal. Judgment and thought content normal.  Perineum: Dry   OB ultrasound 12/26/15: Transverse presentation, normal fluid pocket , maximum vertical pocket 3.8 cm.   OB ultrasound 12/30/15:  Breech presentation. FHR 148.  Cervical length 2.31-2.43cm. No amniotic fluid seen.    ED Course I got a verbal report on the ultrasound findings from MFM, Dr. Sherrie George.  We also discussed  management plan and she recommended outpatient management until about [redacted] weeks EGA with office follow up and then consideration of inpatient management.  Amnisure test was negative.  Patient agreed to outpatient management with follow up at office.    Assessment:  34yo, [id number], at 21 wks 2 days EGA with rescue cerclage, with anhydramnios on ultrasound today, negative amnisure, with presumed preterm premature rupture of membranes, asymptomatic,   Plan: DC home Office follow up in 1 week.  -Plan for MFM consult at 23 weeks for possible admission and further inpatient management. -I reviewed with patient signs, symptoms and precautions of  uterine infection and preterm labor.  She was advised to stop vaginal progesterone.    Dr. Sallye OberKulwa.

## 2016-01-05 ENCOUNTER — Other Ambulatory Visit (HOSPITAL_COMMUNITY): Payer: Self-pay | Admitting: Maternal and Fetal Medicine

## 2016-01-05 DIAGNOSIS — O42912 Preterm premature rupture of membranes, unspecified as to length of time between rupture and onset of labor, second trimester: Secondary | ICD-10-CM

## 2016-01-05 DIAGNOSIS — Z3A21 21 weeks gestation of pregnancy: Secondary | ICD-10-CM

## 2016-01-05 DIAGNOSIS — O3432 Maternal care for cervical incompetence, second trimester: Secondary | ICD-10-CM

## 2016-01-11 ENCOUNTER — Encounter (HOSPITAL_COMMUNITY): Payer: Self-pay

## 2016-01-11 ENCOUNTER — Inpatient Hospital Stay (HOSPITAL_COMMUNITY)
Admission: AD | Admit: 2016-01-11 | Discharge: 2016-01-11 | Disposition: A | Discharge status: Home / Self Care | Payer: Medicaid Other | Source: Ambulatory Visit | Attending: Obstetrics and Gynecology | Admitting: Obstetrics and Gynecology

## 2016-01-11 DIAGNOSIS — E860 Dehydration: Secondary | ICD-10-CM | POA: Diagnosis not present

## 2016-01-11 DIAGNOSIS — R102 Pelvic and perineal pain: Secondary | ICD-10-CM | POA: Insufficient documentation

## 2016-01-11 DIAGNOSIS — O26892 Other specified pregnancy related conditions, second trimester: Secondary | ICD-10-CM | POA: Insufficient documentation

## 2016-01-11 DIAGNOSIS — O3432 Maternal care for cervical incompetence, second trimester: Secondary | ICD-10-CM | POA: Diagnosis not present

## 2016-01-11 DIAGNOSIS — D563 Thalassemia minor: Secondary | ICD-10-CM | POA: Diagnosis not present

## 2016-01-11 DIAGNOSIS — Z3A23 23 weeks gestation of pregnancy: Secondary | ICD-10-CM | POA: Diagnosis not present

## 2016-01-11 DIAGNOSIS — N949 Unspecified condition associated with female genital organs and menstrual cycle: Secondary | ICD-10-CM

## 2016-01-11 DIAGNOSIS — R1032 Left lower quadrant pain: Secondary | ICD-10-CM | POA: Diagnosis present

## 2016-01-11 LAB — URINALYSIS, ROUTINE W REFLEX MICROSCOPIC
Bilirubin Urine: NEGATIVE
Glucose, UA: NEGATIVE mg/dL
Ketones, ur: 15 mg/dL — AB
Nitrite: NEGATIVE
PH: 5.5 (ref 5.0–8.0)
Protein, ur: NEGATIVE mg/dL
SPECIFIC GRAVITY, URINE: 1.025 (ref 1.005–1.030)

## 2016-01-11 LAB — URINE MICROSCOPIC-ADD ON

## 2016-01-11 LAB — WET PREP, GENITAL
CLUE CELLS WET PREP: NONE SEEN
Sperm: NONE SEEN
Trich, Wet Prep: NONE SEEN
Yeast Wet Prep HPF POC: NONE SEEN

## 2016-01-11 MED ORDER — ACETAMINOPHEN 500 MG PO TABS
1000.0000 mg | ORAL_TABLET | Freq: Once | ORAL | Status: AC
  Administered 2016-01-11: 1000 mg via ORAL
  Filled 2016-01-11: qty 2

## 2016-01-11 NOTE — MAU Note (Signed)
Had small amount of bleeding on Thursday and onset of left lower abdominal pain [facility] and told everything still okay and to take Tylenol every 4 hours, pain has persisted, denies leaking of fluids and no bleeding today.

## 2016-01-11 NOTE — Discharge Instructions (Signed)
Round Ligament Pain  The round ligament is a cord of muscle and tissue that helps to support the uterus. It can become a source of pain during pregnancy if it becomes stretched or twisted as the baby grows. The pain usually begins in the second trimester of pregnancy, and it can come and go until the baby is delivered. It is not a serious problem, and it does not cause harm to the baby.  Round ligament pain is usually a short, sharp, and pinching pain, but it can also be a dull, lingering, and aching pain. The pain is felt in the lower side of the abdomen or in the groin. It usually starts deep in the groin and moves up to the outside of the hip area. Pain can occur with:   A sudden change in position.   Rolling over in bed.   Coughing or sneezing.   Physical activity.  HOME CARE INSTRUCTIONS  Watch your condition for any changes. Take these steps to help with your pain:   When the pain starts, relax. Then try:    Sitting down.    Flexing your knees up to your abdomen.    Lying on your side with one pillow under your abdomen and another pillow between your legs.    Sitting in a warm bath for 15-20 minutes or until the pain goes away.   Take over-the-counter and prescription medicines only as told by your health care provider.   Move slowly when you sit and stand.   Avoid long walks if they cause pain.   Stop or lessen your physical activities if they cause pain.  SEEK MEDICAL CARE IF:   Your pain does not go away with treatment.   You feel pain in your back that you did not have before.   Your medicine is not helping.  SEEK IMMEDIATE MEDICAL CARE IF:   You develop a fever or chills.   You develop uterine contractions.   You develop vaginal bleeding.   You develop nausea or vomiting.   You develop diarrhea.   You have pain when you urinate.     This information is not intended to replace advice given to you by your health care provider. Make sure you discuss any questions you have with your health  care provider.     Document Released: 06/01/2008 Document Revised: 11/15/2011 Document Reviewed: 10/30/2014  Elsevier Interactive Patient Education 2016 Elsevier Inc.

## 2016-01-11 NOTE — MAU Note (Signed)
Report given to Alphonzo Severanceachel Stall CNM patient presents with left lower abdominal pain sharp in nature rates 10/10 since Thursday, was evaluated in [facility]'s office on that day reassured and told to take Tylenol every 4 hours for pain, denies bleeding or leaking of fluid, patient on efm. CNM will come to evaluate patient in MAU.

## 2016-01-11 NOTE — MAU Provider Note (Signed)
Heather Little is a 35 yo, G5P1031 at 23.0 wks presenting to MAU unannounced for left lower abdominal pain described as intermittent and sharp that is worse when lying down.  Pain, when "the sharpness occurs" is rated a 10/10 since Thursday.  She was evaluated on  01/08/16  [facility] for the same complaint and was told to take Tylenol every 4 hrs.   Pt states she has not taken tylenol in 2 days because she tried it and "it doesn't work".    Denies leaking of fluid but reports some spotting when she wipes.     Hisptory is significant for rescue cerclage placement on 12/23/15 and three prior negative Amniosures (4/21, 4/23, and 4/25) for leaking of fluid.   Pt is followed by [facility] and MFM for incomepetent certvix, cerclage placement, and anhydramnios, first noted on 4/25.  Next Prenatal visits scheduled on 01/15/15 ([facility]) and 01/20/16 (MFM).  May be considered for inpatient management  After 23.5 wks  (01/16/16) for BMZ series and latency antibiotics.     History     Patient Active Problem List   Diagnosis Date Noted  . Beta thalassemia trait 12/28/2015  . Cervical cerclage suture present in second trimester 12/28/2015  . Vaginal discharge during pregnancy in second trimester 12/26/2015  . Cervical incompetence 12/22/2015    Chief Complaint  Patient presents with  . Abdominal Pain   HPI  OB History    Gravida Para Term Preterm AB TAB SAB Ectopic Multiple Living   5 1 1  0 3 2 0 1 0 1      History reviewed. No pertinent past medical history.  Past Surgical History  Procedure Laterality Date  . Ectopic pregnancy surgery      History reviewed. No pertinent family history.  Social History  Substance Use Topics  . Smoking status: Never Smoker   . Smokeless tobacco: Never Used  . Alcohol Use: No    Allergies: No Known Allergies  Prescriptions prior to admission  Medication Sig Dispense Refill Last Dose  . Prenatal Vit-Fe Fumarate-FA (PRENATAL MULTIVITAMIN) TABS tablet  Take 1 tablet by mouth daily at 12 noon.   01/10/2016 at Unknown time    ROS Physical Exam   Blood pressure 120/66, pulse 93, temperature 98.1 F (36.7 C), resp. rate 16, height 5\' 4"  (1.626 m), weight 76.204 kg (168 lb).   Sterile Vaginal exam:  Watery, yellow/brown discharge;   Cerclage in place, cervix visually closed  Digital exam deferred.    FHT:   Reassuring for gestational age; 150-155 bpm, moderate variability, +accels, occasional variable decelerations  UC: none   Results for orders placed or performed during the hospital encounter of 01/11/16 (from the past 24 hour(s))  Urinalysis, Routine w reflex microscopic (not at [facility])     Status: Abnormal   Collection Time: 01/11/16  8:12 AM  Result Value Ref Range   Color, Urine YELLOW YELLOW   APPearance HAZY (A) CLEAR   Specific Gravity, Urine 1.025 1.005 - 1.030   pH 5.5 5.0 - 8.0   Glucose, UA NEGATIVE NEGATIVE mg/dL   Hgb urine dipstick MODERATE (A) NEGATIVE   Bilirubin Urine NEGATIVE NEGATIVE   Ketones, ur 15 (A) NEGATIVE mg/dL   Protein, ur NEGATIVE NEGATIVE mg/dL   Nitrite NEGATIVE NEGATIVE   Leukocytes, UA MODERATE (A) NEGATIVE  Urine microscopic-add on     Status: Abnormal   Collection Time: 01/11/16  8:12 AM  Result Value Ref Range   Squamous Epithelial / LPF  0-5 (A) NONE SEEN   WBC, UA 0-5 0 - 5 WBC/hpf   RBC / HPF 6-30 0 - 5 RBC/hpf   Bacteria, UA RARE (A) NONE SEEN   Urine-Other MUCOUS PRESENT   Wet prep, genital     Status: Abnormal   Collection Time: 01/11/16  8:56 AM  Result Value Ref Range   Yeast Wet Prep HPF POC NONE SEEN NONE SEEN   Trich, Wet Prep NONE SEEN NONE SEEN   Clue Cells Wet Prep HPF POC NONE SEEN NONE SEEN   WBC, Wet Prep HPF POC MANY (A) NONE SEEN   Sperm NONE SEEN      Physical Exam  ED Course  Assessment: IUP at 23.0 wks Round ligament pain Dehydration, +15 ketones   Plan: Tylenol, PO, 1000 mg; Now PO hydration Tylenol  650 mg q 4 hrs, PRN   ( NOT to exceed 2,500mg  in  24hrs) Encouraged to increase fluid intake at home-  Recommended 6-8 glasses of water per day Keep scheduled  Prenatal Visits - 01/15/15 ([facility]) and 01/20/16 (MFM) Consult with Dr. Normand Sloop  - Pt May be considered for inpatient management on 01/16/16 (23.5wks) for BMZ series and Latency antibiotics DC home in stable condition   Alphonzo Severance CNM, MSN 01/11/2016 9:44 AM

## 2016-01-13 ENCOUNTER — Ambulatory Visit (HOSPITAL_COMMUNITY)
Admission: RE | Admit: 2016-01-13 | Discharge: 2016-01-13 | Disposition: A | Discharge status: Home / Self Care | Payer: Medicaid Other | Source: Ambulatory Visit | Attending: Obstetrics & Gynecology | Admitting: Obstetrics & Gynecology

## 2016-01-13 ENCOUNTER — Inpatient Hospital Stay (HOSPITAL_COMMUNITY): Payer: Medicaid Other

## 2016-01-13 ENCOUNTER — Inpatient Hospital Stay (HOSPITAL_COMMUNITY)
Admission: AD | Admit: 2016-01-13 | Discharge: 2016-01-16 | DRG: 775 | Disposition: A | Discharge status: Home / Self Care | Payer: Medicaid Other | Source: Ambulatory Visit | Attending: Obstetrics & Gynecology | Admitting: Obstetrics & Gynecology

## 2016-01-13 ENCOUNTER — Encounter (HOSPITAL_COMMUNITY): Payer: Self-pay

## 2016-01-13 VITALS — BP 121/61 | HR 83 | Wt 168.6 lb

## 2016-01-13 DIAGNOSIS — O42919 Preterm premature rupture of membranes, unspecified as to length of time between rupture and onset of labor, unspecified trimester: Secondary | ICD-10-CM

## 2016-01-13 DIAGNOSIS — Z3A23 23 weeks gestation of pregnancy: Secondary | ICD-10-CM

## 2016-01-13 DIAGNOSIS — O321XX0 Maternal care for breech presentation, not applicable or unspecified: Secondary | ICD-10-CM | POA: Diagnosis present

## 2016-01-13 DIAGNOSIS — O4102X0 Oligohydramnios, second trimester, not applicable or unspecified: Secondary | ICD-10-CM | POA: Diagnosis present

## 2016-01-13 DIAGNOSIS — O42912 Preterm premature rupture of membranes, unspecified as to length of time between rupture and onset of labor, second trimester: Secondary | ICD-10-CM | POA: Diagnosis present

## 2016-01-13 DIAGNOSIS — R0789 Other chest pain: Secondary | ICD-10-CM | POA: Diagnosis present

## 2016-01-13 DIAGNOSIS — D563 Thalassemia minor: Secondary | ICD-10-CM

## 2016-01-13 DIAGNOSIS — N883 Incompetence of cervix uteri: Secondary | ICD-10-CM | POA: Diagnosis present

## 2016-01-13 DIAGNOSIS — O3432 Maternal care for cervical incompetence, second trimester: Secondary | ICD-10-CM

## 2016-01-13 DIAGNOSIS — N898 Other specified noninflammatory disorders of vagina: Secondary | ICD-10-CM

## 2016-01-13 DIAGNOSIS — O26892 Other specified pregnancy related conditions, second trimester: Secondary | ICD-10-CM

## 2016-01-13 LAB — DIFFERENTIAL
BASOS PCT: 0 %
Basophils Absolute: 0 10*3/uL (ref 0.0–0.1)
EOS PCT: 1 %
Eosinophils Absolute: 0.1 10*3/uL (ref 0.0–0.7)
Lymphocytes Relative: 16 %
Lymphs Abs: 1.9 10*3/uL (ref 0.7–4.0)
MONO ABS: 0.5 10*3/uL (ref 0.1–1.0)
Monocytes Relative: 4 %
Neutro Abs: 9.6 10*3/uL — ABNORMAL HIGH (ref 1.7–7.7)
Neutrophils Relative %: 79 %

## 2016-01-13 LAB — TYPE AND SCREEN
ABO/RH(D): B POS
Antibody Screen: NEGATIVE

## 2016-01-13 LAB — CBC
HCT: 27.6 % — ABNORMAL LOW (ref 36.0–46.0)
Hemoglobin: 9 g/dL — ABNORMAL LOW (ref 12.0–15.0)
MCH: 22.1 pg — AB (ref 26.0–34.0)
MCHC: 32.6 g/dL (ref 30.0–36.0)
MCV: 67.6 fL — AB (ref 78.0–100.0)
PLATELETS: 198 10*3/uL (ref 150–400)
RBC: 4.08 MIL/uL (ref 3.87–5.11)
RDW: 16.2 % — AB (ref 11.5–15.5)
WBC: 12.2 10*3/uL — ABNORMAL HIGH (ref 4.0–10.5)

## 2016-01-13 MED ORDER — SODIUM CHLORIDE 0.9 % IV SOLN
2.0000 g | Freq: Four times a day (QID) | INTRAVENOUS | Status: AC
  Administered 2016-01-13 – 2016-01-15 (×8): 2 g via INTRAVENOUS
  Filled 2016-01-13 (×8): qty 2000

## 2016-01-13 MED ORDER — HYDROXYPROGESTERONE CAPROATE 250 MG/ML IM OIL
250.0000 mg | TOPICAL_OIL | INTRAMUSCULAR | Status: DC
  Administered 2016-01-14: 250 mg via INTRAMUSCULAR
  Filled 2016-01-13: qty 1

## 2016-01-13 MED ORDER — MAGNESIUM SULFATE 50 % IJ SOLN
2.0000 g/h | INTRAMUSCULAR | Status: DC
  Administered 2016-01-13 – 2016-01-14 (×2): 2 g/h via INTRAVENOUS
  Filled 2016-01-13 (×2): qty 80

## 2016-01-13 MED ORDER — ZOLPIDEM TARTRATE 5 MG PO TABS: 5.0000 mg | ORAL_TABLET | Freq: Every evening | ORAL | Status: DC | PRN

## 2016-01-13 MED ORDER — CALCIUM CARBONATE ANTACID 500 MG PO CHEW
2.0000 | CHEWABLE_TABLET | ORAL | Status: DC | PRN
  Filled 2016-01-13: qty 2

## 2016-01-13 MED ORDER — AZITHROMYCIN 250 MG PO TABS
1000.0000 mg | ORAL_TABLET | Freq: Once | ORAL | Status: AC
  Administered 2016-01-13: 1000 mg via ORAL
  Filled 2016-01-13: qty 4

## 2016-01-13 MED ORDER — LACTATED RINGERS IV SOLN
INTRAVENOUS | Status: DC
  Administered 2016-01-13 – 2016-01-15 (×4): via INTRAVENOUS

## 2016-01-13 MED ORDER — ACETAMINOPHEN 325 MG PO TABS: 650.0000 mg | ORAL_TABLET | ORAL | Status: DC | PRN

## 2016-01-13 MED ORDER — DOCUSATE SODIUM 100 MG PO CAPS
100.0000 mg | ORAL_CAPSULE | Freq: Every day | ORAL | Status: DC
  Administered 2016-01-14 – 2016-01-15 (×2): 100 mg via ORAL
  Filled 2016-01-13 (×2): qty 1

## 2016-01-13 MED ORDER — AMOXICILLIN 500 MG PO CAPS
500.0000 mg | ORAL_CAPSULE | Freq: Three times a day (TID) | ORAL | Status: DC
  Administered 2016-01-15: 500 mg via ORAL
  Filled 2016-01-13 (×3): qty 1

## 2016-01-13 MED ORDER — BETAMETHASONE SOD PHOS & ACET 6 (3-3) MG/ML IJ SUSP
12.0000 mg | INTRAMUSCULAR | Status: AC
  Administered 2016-01-13 – 2016-01-14 (×2): 12 mg via INTRAMUSCULAR
  Filled 2016-01-13 (×2): qty 2

## 2016-01-13 MED ORDER — MAGNESIUM SULFATE BOLUS VIA INFUSION
4.0000 g | Freq: Once | INTRAVENOUS | Status: AC
  Administered 2016-01-13: 4 g via INTRAVENOUS
  Filled 2016-01-13: qty 500

## 2016-01-13 MED ORDER — PRENATAL MULTIVITAMIN CH
1.0000 | ORAL_TABLET | Freq: Every day | ORAL | Status: DC
  Administered 2016-01-14 – 2016-01-15 (×2): 1 via ORAL
  Filled 2016-01-13 (×2): qty 1

## 2016-01-13 NOTE — Progress Notes (Signed)
Hospital day # 0 pregnancy at 6063w2d-- vaginal bleeding, Oligohydramnios,Breech,Presummed PPROM, rescue cerclage on 12/23/15.  S:        Perception of contractions: unable to verbalize frequency, but states "I hurt"      Vaginal bleeding: none now and spotting       Vaginal discharge:  no significant change  O: BP 113/63 mmHg  Pulse 99  Temp(Src) 98 F (36.7 C) (Oral)  Resp 20  Ht 5\' 4"  (1.626 m)  Wt 76.476 kg (168 lb 9.6 oz)  BMI 28.93 kg/m2  SpO2 99%      Fetal tracings:   Reassuring for gestational age, 145-150 bpm, +accels, occasional variable deceleratations      Contractions:    None noted      Uterus gravid and non-tender      Extremities: extremities normal, atraumatic, no cyanosis or edema and no significant edema and no signs of DVT          Labs:  Results for orders placed or performed during the hospital encounter of 01/12/2016 (from the past 24 hour(s))  Type and screen [facility]     Status: None   Collection Time: 01/16/2016  2:45 PM  Result Value Ref Range   ABO/RH(D) B POS    Antibody Screen NEG    Sample Expiration 01/16/2016   CBC     Status: Abnormal   Collection Time: 02/02/2016  2:45 PM  Result Value Ref Range   WBC 12.2 (H) 4.0 - 10.5 K/uL   RBC 4.08 3.87 - 5.11 MIL/uL   Hemoglobin 9.0 (L) 12.0 - 15.0 g/dL   HCT 16.127.6 (L) 09.636.0 - 04.546.0 %   MCV 67.6 (L) 78.0 - 100.0 fL   MCH 22.1 (L) 26.0 - 34.0 pg   MCHC 32.6 30.0 - 36.0 g/dL   RDW 40.916.2 (H) 81.111.5 - 91.415.5 %   Platelets 198 150 - 400 K/uL  Differential     Status: Abnormal   Collection Time: 01/18/2016  2:45 PM  Result Value Ref Range   Neutrophils Relative % 79 %   Neutro Abs 9.6 (H) 1.7 - 7.7 K/uL   Lymphocytes Relative 16 %   Lymphs Abs 1.9 0.7 - 4.0 K/uL   Monocytes Relative 4 %   Monocytes Absolute 0.5 0.1 - 1.0 K/uL   Eosinophils Relative 1 %   Eosinophils Absolute 0.1 0.0 - 0.7 K/uL   Basophils Relative 0 %   Basophils Absolute 0.0 0.0 - 0.1 K/uL         Meds:  . ampicillin  (OMNIPEN) IV  2 g Intravenous Q6H   Followed by  . [START ON Aug 29, 2016] amoxicillin  500 mg Oral Q8H  . betamethasone acetate-betamethasone sodium phosphate  12 mg Intramuscular Q24H  . docusate sodium  100 mg Oral Daily  . hydroxyprogesterone caproate  250 mg Intramuscular Weekly  . prenatal multivitamin  1 tablet Oral Q1200    A: 4063w2d with Oligohydramnios,Breech,Presummed PROM, rescue cerclage on 12/23/15     Reassuring      No contractions     Magnesium sulfate infusing      Latency antibiotics infusing     stable  P: Continue current plan of care      Upcoming tests/treatments:          GBS pending      MDs will follow  Heather Little CNM, [place] 01/28/2016 7:06 PM

## 2016-01-13 NOTE — Consult Note (Signed)
Maternal Fetal Medicine Consultation  Requesting Provider(s): Hoover Browns, MD  Reason for consultation: History of cervical insufficiency s/p rescue cerclage - oligohydramnios with negative amnisure x 2  HPI: Heather Little is a 35 yo G5P1031, EDD 05/09/2016 who is currently at 23w 2d seen for consultation due to hx of rescue cerclage and now severe oligohydramnios.  Heather Little was seen in MAU on 4/18 - noted to have "bulging membranes" - was later transferred to [facility] and underwent a rescue cerclage on Heather MFM service.  On 4/21, she was seen again in MAU where she reported leakage of fluid - underwent a work up at that time that showed a negative amnisure result.  She returned for a second evaluation 48 hours later that was also negative.  She reports that Heather leakage of fluid has subsided- notes some vaginal spotting but is otherwise without complaints.  Limited ultrasound performed on 5/2 showed "severe oligohydramnios" - Heather Little is now seen for recommendations regarding inpatient vs. outpatient management.  She is currently on vaginal progesterone.  OB History: OB History    Gravida Para Term Preterm AB TAB SAB Ectopic Multiple Living   0 3 2 0 1 0 1    TAB x 2; ectopic x 1; term SVD without complications x 1  PMH: History reviewed. No pertinent past medical history.   PSH:  Past Surgical History  Procedure Laterality Date  . Ectopic pregnancy surgery     Meds:  Current Outpatient Prescriptions on File Prior to Encounter  Medication Sig Dispense Refill  . Prenatal Vit-Fe Fumarate-FA (PRENATAL MULTIVITAMIN) TABS tablet Take 1 tablet by mouth daily at 12 noon.     No current facility-administered medications on file prior to encounter.   Allergies: No Known Allergies   FH: History reviewed. No pertinent family history.   Soc:  Social History   Social History  . Marital Status: Married    Spouse Name: N/A  . Number of Children: N/A  . Years of Education:  N/A   Occupational History  . Not on file.   Social History Main Topics  . Smoking status: Never Smoker   . Smokeless tobacco: Never Used  . Alcohol Use: No  . Drug Use: No  . Sexual Activity: Not on file   Other Topics Concern  . Not on file   Social History Narrative    Review of Systems: no vaginal bleeding or cramping/contractions, no LOF, no nausea/vomiting. All other systems reviewed and are negative.  PE:   Filed Vitals:   02-03-2016 1330  BP: 121/61  Pulse: 83    A/P: 1) Single IUP at 23w 2d  2) Hx of cervical insufficiency s/p rescue cerclage  3) Oligohydramnios - s/p amnisure x 2 that were negative.  Given Heather circumstances, with normal amniotic fluid volume previously and new onset oligohydramnios following a rescue cerclage - clearly Heather most logical explanation is premature rupture of membranes despite Heather results of laboratory testing.  I would recommend that she be treated as PROM.  Would recommend inpatient management - admission, latency antibiotics, betamethasone and NICU consult.  Recommend ultrasounds for growth every 4 weeks while inpatient, and at least daily fetal strips.  Recommend delivery at [redacted] weeks gestation in Heather absence of other complications.   Recommendations were discussed with Dr. Su Hilt.   Thank you for Heather opportunity to be a part of Heather care of Heather Little. Please contact our office if we can be of further  assistance.   I spent approximately 30 minutes with this Little with over 50% of time spent in face-to-face counseling.  Alpha GulaPaul Jamani Bearce, MD Maternal Fetal Medicine

## 2016-01-13 NOTE — ED Notes (Signed)
Pt reports cramping with small amt of dark blood today.

## 2016-01-13 NOTE — H&P (Addendum)
Heather Little is a 35 y.o. female presenting for admission after being seen for consultation by MFM, Dr. Claudean SeveranceWhitecar, today regarding whether she should be monitored in patient vs outpatient.  Dr. Claudean SeveranceWhitecar spoke with me and recommended inpatient management under the assumption that she is ruptured which is the reason she does not have any fluid in spite of negative amnisures.  She [facility].  Please see Dr. Fredda HammedWhitecar's consult note from today.  History OB History    Gravida Para Term Preterm AB TAB SAB Ectopic Multiple Living   5 1 1  0 3 2 0 1 0 1     No past medical history on file. Past Surgical History  Procedure Laterality Date  . Ectopic pregnancy surgery     Family History: family history is not on file. Social History:  reports that she has never smoked. She has never used smokeless tobacco. She reports that she does not drink alcohol or use illicit drugs.   Prenatal Transfer Tool  Maternal Diabetes: No Genetic Screening: Normal Maternal Ultrasounds/Referrals: Normal no anomalies but cervix on 4/17 with funneling to os 0.18cm in length Fetal Ultrasounds or other Referrals:  None Maternal Substance Abuse:  No Significant Maternal Medications:  None Significant Maternal Lab Results:  None Other Comments:  pap with ASCUS and neg HRHPV 10/13/15  ROS Non-contributory   There were no vitals taken for this visit. Exam Physical Exam  Prenatal labs: ABO, Rh: --/--/B POS, B POS (04/17 1205) Antibody: NEG (04/17 1205) Rubella:  Immune  RPR:   NR HBsAg:   neg HIV:   NR GBS:   to be collected this admission  On 5/2 - u/s breech with severe oligo and cvx 2.4cm  Assessment/Plan: P1 at 23 2/7 wks s/p [facility] on 12/23/15 with PPROM unknown when it actually occurred but pt has no fluid on u/s a week ago and seen by Dr. Arville LimeWhitecare today who recommends treating pt like PPROM in spite of neg amnisures (x2).   Latency abx ordered as well as BMZ.  GBS culture ordered as well. NICU consult ordered. MFM to do u/s for EFW later today.  Purcell NailsROBERTS,Nashid Pellum Y 01/29/2016, 2:30 PM  Called by RN as they are trying to get IV in pt reports discomfort every 6 - 9 min with assoc variable.  Pam, RN thinks pt is contracting.  Magnesium ordered.  5:50p RN reported to me variables about a half hour ago with small amount of bleeding.  Tracing improved now with good variability and no obvious decels but some loss of contact.  I discussed 17P with Dr. Claudean SeveranceWhitecar and in the event she is actually not ruptured it may help and there is little downside if she is.  I reviewed with patient.  I also discussed delivery options with the patient in light of being breech.  I am awaiting official report on u/s but patient does not want a c/s considering early gestational age and would like cerclage to be removed if moving toward delivery.

## 2016-01-14 ENCOUNTER — Inpatient Hospital Stay (HOSPITAL_COMMUNITY): Payer: Medicaid Other

## 2016-01-14 DIAGNOSIS — N96 Recurrent pregnancy loss: Secondary | ICD-10-CM | POA: Insufficient documentation

## 2016-01-14 LAB — RPR: RPR Ser Ql: NONREACTIVE

## 2016-01-14 LAB — HIV ANTIBODY (ROUTINE TESTING W REFLEX): HIV Screen 4th Generation wRfx: NONREACTIVE

## 2016-01-14 MED ORDER — SODIUM CHLORIDE 0.9% FLUSH
10.0000 mL | INTRAVENOUS | Status: DC | PRN
  Filled 2016-01-14: qty 40

## 2016-01-14 MED ORDER — SODIUM CHLORIDE 0.9% FLUSH
10.0000 mL | Freq: Two times a day (BID) | INTRAVENOUS | Status: DC
  Administered 2016-01-15 – 2016-01-16 (×2): 10 mL
  Filled 2016-01-14 (×2): qty 40

## 2016-01-14 NOTE — Progress Notes (Addendum)
Hospital day # 1 pregnancy at 3844w3d with cervical incompetence s/p rescue cerclage, PPROM  S: well, reports good fetal activity      Contractions:none      Vaginal bleeding: decreasing and now brownish       Vaginal discharge: no significant change  O: BP 113/54 mmHg  Pulse 85  Temp(Src) 98.1 F (36.7 C) (Oral)  Resp 16  Ht 5\' 4"  (1.626 m)  Wt 168 lb 9.6 oz (76.476 kg)  BMI 28.93 kg/m2  SpO2 98%      Fetal tracings:reviewed and reassuring with baseline 125-130 and presence of accelerations, occasional variable deceleration      Uterus non-tender      Extremities: no significant edema and no signs of DVT  Little: 4344w3d with PPROM and rescue cerclage     Breech presentation    stable  P: continue current plan of care with latency antibiotics and upcoming NICU consult      Discussed with patient risks associated with premature breech vaginal delivery: incomplete delivery, head entrapment, need for Duhrsen's cervical incisions, need for emergent cesarean section. At every point R&B of recommended mode of delivery to be reviewed with patient who will have final decision.   AVWUJW,JXBJYNRIVARD,Heather Lafferty A  MD 01/14/2016 12:39 PM

## 2016-01-14 NOTE — Progress Notes (Signed)
Subjective: Chart and Strip review  Objective: BP 114/54 mmHg  Pulse 102  Temp(Src) 98 F (36.7 C) (Oral)  Resp 16  Ht 5\' 4"  (1.626 m)  Wt 76.476 kg (168 lb 9.6 oz)  BMI 28.93 kg/m2  SpO2 97% I/O last 3 completed shifts: In: 5550 [IV Piggyback:50] Out: 600 [Urine:600]    FHT: Overall reassuring fetal heart tones for gestational age - 130-135 bpm, moderate variability, accels  ([id number])        However - Some periods of  Loss of contact and Occasional prolonged decelerations noted prior  ([phone]) UC:   none  Assessment:  8880w2d with Oligohydramnios,Breech,Presummed PROM, rescue cerclage on 12/23/15 Overall Reassuring fetal heart tones   No contractions  Magnesium sulfate infusing   Latency antibiotics infusing      BMZ x1 dose at 1500  stable   Plan: Continue current plan of care  Upcoming tests/treatments:   GBS pending             BMZ, second dose due 01/14/16 at 1500  MDs will follow  Alphonzo Severanceachel Beau Ramsburg CNM, [place] 01/14/2016, 3:59 AM

## 2016-01-14 NOTE — Progress Notes (Signed)
Attempted to place IV in right hand.  #22 gauge and was unable to obtain access.

## 2016-01-14 NOTE — Progress Notes (Signed)
Peripherally Inserted Central Catheter/Midline Placement  The IV Nurse has discussed with the patient and/or persons authorized to consent for the patient, the purpose of this procedure and the potential benefits and risks involved with this procedure.  The benefits include less needle sticks, lab draws from the catheter and patient may be discharged home with the catheter.  Risks include, but not limited to, infection, bleeding, blood clot (thrombus formation), and puncture of an artery; nerve damage and irregular heat beat.  Alternatives to this procedure were also discussed.  PICC/Midline Placement Documentation  PICC Single Lumen 01/14/16 PICC Right Brachial 43 cm 0 cm (Active)       Stacie GlazeJoyce, Cornelious Diven Horton 01/14/2016, 2:13 PM

## 2016-01-15 ENCOUNTER — Encounter (HOSPITAL_COMMUNITY): Payer: Self-pay | Admitting: *Deleted

## 2016-01-15 LAB — CULTURE, BETA STREP (GROUP B ONLY)

## 2016-01-15 MED ORDER — ZOLPIDEM TARTRATE 5 MG PO TABS: 5.0000 mg | ORAL_TABLET | Freq: Every evening | ORAL | Status: DC | PRN

## 2016-01-15 MED ORDER — DIBUCAINE 1 % RE OINT: 1.0000 "application " | TOPICAL_OINTMENT | RECTAL | Status: DC | PRN

## 2016-01-15 MED ORDER — BENZOCAINE-MENTHOL 20-0.5 % EX AERO: 1.0000 "application " | INHALATION_SPRAY | CUTANEOUS | Status: DC | PRN

## 2016-01-15 MED ORDER — ONDANSETRON HCL 4 MG/2ML IJ SOLN: 4.0000 mg | INTRAMUSCULAR | Status: DC | PRN

## 2016-01-15 MED ORDER — OXYTOCIN 40 UNITS IN LACTATED RINGERS INFUSION - SIMPLE MED
INTRAVENOUS | Status: AC
  Administered 2016-01-15: 40 [IU]
  Filled 2016-01-15: qty 1000

## 2016-01-15 MED ORDER — ONDANSETRON HCL 4 MG PO TABS: 4.0000 mg | ORAL_TABLET | ORAL | Status: DC | PRN

## 2016-01-15 MED ORDER — SIMETHICONE 80 MG PO CHEW: 80.0000 mg | CHEWABLE_TABLET | ORAL | Status: DC | PRN

## 2016-01-15 MED ORDER — WITCH HAZEL-GLYCERIN EX PADS: 1.0000 "application " | MEDICATED_PAD | CUTANEOUS | Status: DC | PRN

## 2016-01-15 MED ORDER — DIPHENHYDRAMINE HCL 25 MG PO CAPS: 25.0000 mg | ORAL_CAPSULE | Freq: Four times a day (QID) | ORAL | Status: DC | PRN

## 2016-01-15 MED ORDER — SENNOSIDES-DOCUSATE SODIUM 8.6-50 MG PO TABS
2.0000 | ORAL_TABLET | ORAL | Status: DC
  Administered 2016-01-15: 2 via ORAL
  Filled 2016-01-15: qty 2

## 2016-01-15 MED ORDER — ACETAMINOPHEN 325 MG PO TABS: 650.0000 mg | ORAL_TABLET | ORAL | Status: DC | PRN

## 2016-01-15 MED ORDER — IBUPROFEN 600 MG PO TABS
600.0000 mg | ORAL_TABLET | Freq: Four times a day (QID) | ORAL | Status: DC
  Administered 2016-01-15 – 2016-01-16 (×4): 600 mg via ORAL
  Filled 2016-01-15 (×4): qty 1

## 2016-01-15 NOTE — Consult Note (Signed)
Neonatology Note:  Attendance at Code Apgar:  Our team responded to a Code Apgar call to room # 156 following precipitous vaginal delivery of a 23 4/[redacted] week EGA infant. The requesting physician was Dr. Kulwa. The mother is a 34 yo G5P1031 with hx of rescue cerclage and now severe oligohydramnios who had been threatening to deliver thus was admitted for latency antibiotics, betamethasone, mag and monitoring. US on 5/9 notable for measurements c/w ~19-23 wk infant and an EFW of ~450g. The OB nursing staff in attendance called a Code Apgar during precipitous delivery in bathroom around the cerclage. Our team was present at birth. HR < 60 bpm with no respirattry effort or tone. Equipment was set up and aggressive resuscitation begun within a minute of life. There was no response in HR to aggressive non-invasive efforts. Effort was made to place 2.5 ETT. Vocal cords visualized however unable to pass ETT due to trachea being too small for ETT. Overall, infant exam c/w extremely premature infant. Ap 1/1/1. I spoke with the parents in the DR and informed them of their child's grave prognosis due to extreme prematurity and our inability to effectively intervene as a result. Decision made to stop and allow them to hold. They expressed understanding. Their child was wrapped in a blanket and placed on mother's chest for bonding and grieving.   Please do not hesitate to contact me if questions or concerns.   Allyse Fregeau, MD 

## 2016-01-15 NOTE — Progress Notes (Signed)
I called and spoke with Dr Sallye OberKulwa, she will come by tomorrow (01/16/16) and sign fetal death certificate.

## 2016-01-15 NOTE — Progress Notes (Signed)
  Heather Little @ 23 W 4 d EGA with rescue cerclage, oligohydramnios, presumed PPROM   Subjective: Patient reports no complaints. She denies abdominal cramping, contractions or vaginal bleeding.  With gross fetal movement.  No fevers/chills.   Objective:  I have reviewed patient's vital signs, intake and output, medications and labs. Filed Vitals:   01/16/2016 0303 01/27/2016 0400 01/24/2016 0524 01/31/2016 0747  BP:   104/62 107/58  Pulse:   83 86  Temp:   97.4 F (36.3 C) 98.8 F (37.1 C)  TempSrc:   Oral Oral  Resp: 18 18 18 18   Height:      Weight:      SpO2:       Input in 24 hrs Total I/O In: 615 [P.O.:240; I.V.:375] Out: 400 [Urine:400]  Past 24 hrs up to 7am 5/11: Input 4370cc.  Urine output 2200cc. Net in 2170.  General: alert, cooperative and no distress Resp: clear to auscultation bilaterally Cardio: regular rate and rhythm, S1, S2 normal, no murmur, click, rub or gallop GI: soft, non-tender; bowel sounds normal; no masses,  no organomegaly Extremities: extremities normal, atraumatic, no cyanosis or edema, no edema, redness or tenderness in the calves or thighs and Patellar reflex bilaterally:   NST reviewed 5/11@0816am : 130 Baseline, mod var, Reactive.  No decelerations.  TOCO: No contractions.     Marland Kitchen. amoxicillin  500 mg Oral Q8H  . docusate sodium  100 mg Oral Daily  . hydroxyprogesterone caproate  250 mg Intramuscular Weekly  . prenatal multivitamin  1 tablet Oral Q1200  . sodium chloride flush  10-40 mL Intracatheter Q12H    Assessment:  23 wks 4 days EGA with rescue cerclage, oligohydramnios, presumed PPROM, stable Breech presentation 17P weekly, q Wednesday S/p BMZ on 5/9 & 5/10 Picc line placed 5/10 for IV access (difficult to get regular IV access) GBS pending Reassuring FT  Continue with magnesium sulfate until Beta complete.   Plan: Nicu consult Continue latency antibiotics Continue current plan of care Mode of delivery discussed again, patient agrees to  cesarean delivery if breech presentation at time of delivery.  She understands possible need for classical cesarean delivery as well as risks of bleeding, infection, damage to organs.    LOS: 2 days    [facility],Heather Little [facility] 01/29/2016, 9:32 AM

## 2016-01-15 NOTE — Progress Notes (Signed)
Patient in bathroom delivered infant on toilet.  Labor and delivery nurses  in the room.  I was in another room and called out.

## 2016-01-15 NOTE — Progress Notes (Signed)
Patient doing well no uterine contractions reported or noted on monitor.  Magnesium Sulfate bag is empty, it is to be stopped at 4pm I requested not to reorder a new bag for only 30 minutes.  Patient is also requesting a shower and NICU tour.  Dr Sallye OberKulwa said patient may shower and do NICU tour tomorrow.

## 2016-01-15 NOTE — Progress Notes (Signed)
Subjective: Strip and chart review  Objective: BP 101/59 mmHg  Pulse 90  Temp(Src) 98.2 F (36.8 C) (Oral)  Resp 18  Ht  (1.626 m)  Wt 76.476 kg (168 lb 9.6 oz)  BMI 28.93 kg/m2  SpO2 98% I/O last 3 completed shifts: In: 4070 [P.O.:720; I.V.:3000; IV Piggyback:350] Out: 3250 [Urine:3250] Total I/O In: 1015 [P.O.:340; I.V.:625; IV Piggyback:50] Out: 550 [Urine:550]   Results for orders placed or performed during the hospital encounter of 01/17/2016 (from the past 48 hour(s))  Type and screen [facility]     Status: None   Collection Time: 01/25/2016  2:45 PM  Result Value Ref Range   ABO/RH(D) B POS    Antibody Screen NEG    Sample Expiration 01/16/2016   RPR     Status: None   Collection Time: 01/21/2016  2:45 PM  Result Value Ref Range   RPR Ser Ql Non Reactive Non Reactive    Comment: (NOTE) Performed At: [facility] [address] [place], [place] [id number] Mila Homer MD WU:[id number]   CBC     Status: Abnormal   Collection Time: 01/16/2016  2:45 PM  Result Value Ref Range   WBC 12.2 (H) 4.0 - 10.5 K/uL   RBC 4.08 3.87 - 5.11 MIL/uL   Hemoglobin 9.0 (L) 12.0 - 15.0 g/dL   HCT 95.6 (L) 21.3 - 08.6 %   MCV 67.6 (L) 78.0 - 100.0 fL   MCH 22.1 (L) 26.0 - 34.0 pg   MCHC 32.6 30.0 - 36.0 g/dL   RDW 57.8 (H) 46.9 - 62.9 %   Platelets 198 150 - 400 K/uL  Differential     Status: Abnormal   Collection Time: 01/22/2016  2:45 PM  Result Value Ref Range   Neutrophils Relative % 79 %   Neutro Abs 9.6 (H) 1.7 - 7.7 K/uL   Lymphocytes Relative 16 %   Lymphs Abs 1.9 0.7 - 4.0 K/uL   Monocytes Relative 4 %   Monocytes Absolute 0.5 0.1 - 1.0 K/uL   Eosinophils Relative 1 %   Eosinophils Absolute 0.1 0.0 - 0.7 K/uL   Basophils Relative 0 %   Basophils Absolute 0.0 0.0 - 0.1 K/uL  HIV antibody (routine testing)     Status: None   Collection Time: 01/07/2016  2:45 PM  Result Value Ref Range   HIV Screen 4th Generation wRfx Non Reactive Non  Reactive    Comment: (NOTE) Performed At: [facility]  [address] [place], [place] [id number] Mila Homer MD WN:[id number]   Culture, beta strep (group b only)     Status: None (Preliminary result)   Collection Time: 01/07/2016  3:00 PM  Result Value Ref Range   Specimen Description VAGINAL/RECTAL    Special Requests NONE    Culture      Culture reincubated for better growth Performed at [facility]    Report Status PENDING     . ampicillin (OMNIPEN) IV  2 g Intravenous Q6H   Followed by  . amoxicillin  500 mg Oral Q8H  . docusate sodium  100 mg Oral Daily  . hydroxyprogesterone caproate  250 mg Intramuscular Weekly  . prenatal multivitamin  1 tablet Oral Q1200  . sodium chloride flush  10-40 mL Intracatheter Q12H      FHT: Reassuring for gestational age, 130 bpm baseline, +accelarations, occasional variable decelerarions UC:   none  Assessment:  23.3 wks with PPROM and rescue cerclage Breech presentation 17P  weekly, q Wednesday S/p BMZ on 5/9 & 5/10 Picc line placed 5/10 GBS pending Reassuring FT stable  Plan: Nicu consult Continue latency antibiotics Continue current plan of care MDs Will follow  Alphonzo SeveranceRachel Alanda Colton CNM, [place] 2016/06/19, 12:27 AM

## 2016-01-15 NOTE — Progress Notes (Signed)
Initial visit with the Cella family upon the birth of their daughter.  Pt delivered while using the toilet and baby was too small to be intubated.  Supported parents with prayer at their request and sat with their daughter Naphtiri while medical team worked with mother and baby in the patient's room.    I spent significant time with the family, particularly providing grief support to the couple's 34 year old daughter as she absorbed the news that her sister was not big enough to survive.  Helped family to notify her and facilitated with making memories by providing book and encouraging her to sing to her sister.  FOB allowed Naftiri to name her baby sister and she chose the name Kara Meadmma.  Naftiri confided that she was worried about her mommy and daddy and sad that Kara Meadmma might die.  I assured her that it was okay to be sad and that she did not need to take care of her parents, her father reiterated that they will take care of her, and I reminded them that we are here for additional support.  Left Naftiri with coloring sheet at her request.  Family is aware of how to request chaplain support.  Will follow up in AM.  Please page as further needs arise.  Maryanna ShapeAmanda M. Carley Hammedavee Lomax, M.Div. [facility] Chaplain Pager [phone] Office [phone]     01/21/2016 1730  Clinical Encounter Type  Visited With Patient and family together  Visit Type Initial;Spiritual support;Social support  Referral From Nurse  Consult/Referral To Nurse  Spiritual Encounters  Spiritual Needs Prayer;Emotional;Grief support  Stress Factors  Patient Stress Factors Loss;Loss of control;Major life changes

## 2016-01-15 NOTE — Progress Notes (Signed)
Responded to Code Apgar for precipitous delivery of a 23 week infant.  Resuscitation efforts provided by Respiratory Care and Medical Team.  Subsequent charges that appear reflect those resuscitation efforts.

## 2016-01-16 LAB — CBC
HCT: 23 % — ABNORMAL LOW (ref 36.0–46.0)
HEMOGLOBIN: 7.9 g/dL — AB (ref 12.0–15.0)
MCH: 23.2 pg — ABNORMAL LOW (ref 26.0–34.0)
MCHC: 34.3 g/dL (ref 30.0–36.0)
MCV: 67.4 fL — ABNORMAL LOW (ref 78.0–100.0)
PLATELETS: 205 10*3/uL (ref 150–400)
RBC: 3.41 MIL/uL — AB (ref 3.87–5.11)
RDW: 16.7 % — ABNORMAL HIGH (ref 11.5–15.5)
WBC: 11.4 10*3/uL — AB (ref 4.0–10.5)

## 2016-01-16 MED ORDER — CALCIUM CARBONATE ANTACID 500 MG PO CHEW
2.0000 | CHEWABLE_TABLET | Freq: Two times a day (BID) | ORAL | Status: DC
  Administered 2016-01-16: 400 mg via ORAL

## 2016-01-16 MED ORDER — FAMOTIDINE 20 MG PO TABS
20.0000 mg | ORAL_TABLET | Freq: Two times a day (BID) | ORAL | Status: DC
  Administered 2016-01-16: 20 mg via ORAL
  Filled 2016-01-16: qty 1

## 2016-01-16 MED ORDER — IBUPROFEN 600 MG PO TABS: 600.0000 mg | ORAL_TABLET | Freq: Four times a day (QID) | ORAL | Status: DC | PRN

## 2016-01-16 NOTE — Progress Notes (Signed)
I offered support to pt and family as they were preparing to leave the hospital. Pt was not able to speak at this time, but I provided additional support for her 34 year old daughter who shared some of her emotional process with me.  In supporting their child, I hope that I was able to provide secondary support for the parents.  They are aware of our ongoing availability for support.  Chaplain Dyanne CarrelKaty Tahirih Lair, Bcc Pager, [phone] 3:49 PM    01/16/16 1500  Clinical Encounter Type  Visited With Family  Visit Type Spiritual support  Referral From Chaplain

## 2016-01-16 NOTE — Progress Notes (Addendum)
Post Partum Day 1 Subjective: no complaints, up ad lib, voiding, tolerating PO and pt reports minimal bleeding s/p delivery of 23wker with demise.  Pt has flat affect upon entering room.  She denies a h/o depression.  Reports chest discomfort.  Objective: Blood pressure 99/52, pulse 64, temperature 98.5 F (36.9 C), temperature source Oral, resp. rate 16, height 5\' 4"  (1.626 m), weight 168 lb 9.6 oz (76.476 kg), SpO2 99 %, unknown if currently breastfeeding.  Physical Exam:  General: alert Lochia: appropriate Uterine Fundus: firm Incision: n/a DVT Evaluation: No evidence of DVT seen on physical exam.   Recent Labs  01/18/2016 1445 01/16/16 0514  HGB 9.0* 7.9*  HCT 27.6* 23.0*    Assessment/Plan: Social Work consult  Cont routine PP care Trial of tums and pepcid for possible gerd   LOS: 3 days   Ali Mclaurin Y 01/16/2016, 9:56 AM

## 2016-01-16 NOTE — Progress Notes (Signed)
Pt verbalizes understanding of d/c instructions, when to seek medical care and belongings policy. MD discussed precautions with her. Pt has no questions but was eager to leave. Pt's spouse & young daughter is at the bedside and he will be driving her home.NO IV at time of d/c. Sheryn BisonGordon, Konnor Jorden Warner

## 2016-01-16 NOTE — Discharge Summary (Signed)
Physician Discharge Summary  Patient ID: Heather Little MRN: [id number] DOB/AGE: July 19, 1981 35 y.o.  Admit date: 01/11/2016 Discharge date: 01/16/2016  Admission Diagnoses: PPROM s/p rescue cerclage at 23wks  Discharge Diagnoses:  Principal Problem:   Early neonatal death--PPROM, delivery at 23 4/7 weeks Active Problems:   Cervical incompetence   Preterm premature rupture of membranes (PPROM) with unknown onset of labor   Discharged Condition: stable but at risk for PP depression (f/u set up in office on Monday.  Pt denies SI or HI)  Hospital Course: Pt was admitted for observation and suddenly progressed and delivered a non-viable baby.  Temp to 99, fever precautions reviewed.  Pt desiring discharge on PPD 1 and physically doing well but concerned about PP depression.  SW has seen pt.  I discussed precautions with patient as well along with FOP.  Consults: None  Significant Diagnostic Studies: none  Treatments: IV hydration, analgesia: ibuprofen and delivery  Discharge Exam: Blood pressure 108/52, pulse 71, temperature 99.4 F (37.4 C), temperature source Oral, resp. rate 18, height 5\' 4"  (1.626 m), weight 168 lb 9.6 oz (76.476 kg), SpO2 97 %, unknown if currently breastfeeding.  See PN for today for physical exam  Disposition: 01-Home or Self Care     Medication List    TAKE these medications        acetaminophen 500 MG tablet  Commonly known as:  TYLENOL  Take 1,000 mg by mouth every 6 (six) hours as needed for mild pain.     ibuprofen 600 MG tablet  Commonly known as:  ADVIL,MOTRIN  Take 1 tablet (600 mg total) by mouth every 6 (six) hours as needed.     prenatal multivitamin Tabs tablet  Take 1 tablet by mouth daily at 12 noon.                  Follow-up Information    Follow up with Konrad FelixKULWA,EMA WAKURU, MD On 01/19/2016.   Specialty:  Obstetrics and Gynecology   Why:  f/u in the office on Monday 15th at 4pm.  Also schedule a 6wk post partum visit.   Contact information:   [address] [place] [id number] [phone]       Signed: Purcell NailsROBERTS,Jorey Dollard Y 01/16/2016, 2:38 PM

## 2016-01-16 NOTE — Lactation Note (Signed)
Lactation Consultation Note  Brief conversation with patient regarding breast care and comfort measures.  Patient Name: Benard Rinkenneh Raap ZOXWR'UToday's Date: 01/16/2016     Maternal Data    Feeding    LATCH Score/Interventions                      Lactation Tools Discussed/Used     Consult Status      Soyla DryerJoseph, Kymberlyn Eckford 01/16/2016, 11:30 AM

## 2016-01-16 NOTE — Progress Notes (Signed)
CSW received consult for neonatal death at 23.4 weeks.  CSW notes that Chaplain is aware and has been involved with family.  CSW screening out referral at this time and will speak with Chaplain to assess any further needs for family.

## 2016-01-19 ENCOUNTER — Encounter (HOSPITAL_COMMUNITY): Payer: Self-pay | Admitting: *Deleted

## 2016-01-21 ENCOUNTER — Telehealth (HOSPITAL_COMMUNITY): Payer: Self-pay | Admitting: *Deleted

## 2016-01-22 ENCOUNTER — Encounter (HOSPITAL_COMMUNITY): Payer: Self-pay | Admitting: *Deleted

## 2016-02-05 DIAGNOSIS — 419620001 Death: Secondary | SNOMED CT

## 2016-02-05 DEATH — deceased

## 2016-08-23 ENCOUNTER — Ambulatory Visit (INDEPENDENT_AMBULATORY_CARE_PROVIDER_SITE_OTHER): Payer: Self-pay | Admitting: *Deleted

## 2016-08-23 DIAGNOSIS — Z3200 Encounter for pregnancy test, result unknown: Secondary | ICD-10-CM

## 2016-08-23 DIAGNOSIS — Z3201 Encounter for pregnancy test, result positive: Secondary | ICD-10-CM

## 2016-08-23 LAB — POCT URINE PREGNANCY: PREG TEST UR: POSITIVE — AB

## 2016-08-23 NOTE — Progress Notes (Signed)
Pt in office for upt.  UPT in office is positive today. Pt states LMP 07/03/16. Pt advised she may make NOB appt at check out today if she desires. Pt made aware of S&S to seek emergent care. Pt has no other concerns today.

## 2016-09-06 NOTE — L&D Delivery Note (Signed)
Delivery Note At 8:40 PM a viable female was delivered via Vaginal, Spontaneous Delivery (Presentation: LOA).  APGAR: 9/9; weight  5 lbs 2.4 oz.   Placenta status: intact, via Tomasa BlaseSchultz.  Cord: 3VC with the following complications: short cord.  Cord pH: n/a  Anesthesia:  none Episiotomy:  none Lacerations:  Small vaginal Suture Repair: 3.0 vicryl Est. Blood Loss (mL): 100  Mom to postpartum.  Baby to Couplet care / Skin to Skin.  Raelyn Moraolitta Marquesha Robideau, CNM, MSN 03/15/2017, 9:00 PM

## 2016-09-14 ENCOUNTER — Encounter: Payer: Self-pay | Admitting: Obstetrics

## 2016-09-21 ENCOUNTER — Encounter: Payer: Self-pay | Admitting: Obstetrics

## 2016-09-21 ENCOUNTER — Other Ambulatory Visit (HOSPITAL_COMMUNITY)
Admission: RE | Admit: 2016-09-21 | Discharge: 2016-09-21 | Disposition: A | Discharge status: Home / Self Care | Payer: Medicaid Other | Source: Ambulatory Visit | Attending: Obstetrics | Admitting: Obstetrics

## 2016-09-21 ENCOUNTER — Ambulatory Visit (INDEPENDENT_AMBULATORY_CARE_PROVIDER_SITE_OTHER): Payer: Medicaid Other | Admitting: Obstetrics

## 2016-09-21 VITALS — BP 104/70 | HR 82 | Wt 179.2 lb

## 2016-09-21 DIAGNOSIS — Z3490 Encounter for supervision of normal pregnancy, unspecified, unspecified trimester: Secondary | ICD-10-CM | POA: Insufficient documentation

## 2016-09-21 DIAGNOSIS — O09211 Supervision of pregnancy with history of pre-term labor, first trimester: Secondary | ICD-10-CM

## 2016-09-21 DIAGNOSIS — Z01419 Encounter for gynecological examination (general) (routine) without abnormal findings: Secondary | ICD-10-CM | POA: Insufficient documentation

## 2016-09-21 DIAGNOSIS — O3431 Maternal care for cervical incompetence, first trimester: Secondary | ICD-10-CM | POA: Diagnosis not present

## 2016-09-21 DIAGNOSIS — O3430 Maternal care for cervical incompetence, unspecified trimester: Secondary | ICD-10-CM

## 2016-09-21 DIAGNOSIS — Z1151 Encounter for screening for human papillomavirus (HPV): Secondary | ICD-10-CM | POA: Diagnosis not present

## 2016-09-21 NOTE — Progress Notes (Signed)
Pt presents for NOB visit. Pt agrees to first trimester screening.

## 2016-09-21 NOTE — Progress Notes (Signed)
Subjective:    Heather Little is being seen today for her first obstetrical visit.  This is not a planned pregnancy. She is at 5275w3d gestation. Her obstetrical history is significant for previous preterm delivery with a cerclage in place at 23 weeks.. Relationship with FOB: spouse, living together. Patient does intend to breast feed. Pregnancy history fully reviewed.  The information documented in the HPI was reviewed and verified.  Menstrual History: OB History    Gravida Para Term Preterm AB Living   6 2 1 1 3 1    SAB TAB Ectopic Multiple Live Births   0 2 1 0 1       Patient's last menstrual period was 07/03/2016.    No past medical history on file.  Past Surgical History:  Procedure Laterality Date  . ECTOPIC PREGNANCY SURGERY       (Not in a hospital admission) No Known Allergies  Social History  Substance Use Topics  . Smoking status: Never Smoker  . Smokeless tobacco: Never Used  . Alcohol use No    History reviewed. No pertinent family history.   Review of Systems Constitutional: negative for weight loss Gastrointestinal: negative for vomiting Genitourinary:negative for genital lesions and vaginal discharge and dysuria Musculoskeletal:negative for back pain Behavioral/Psych: negative for abusive relationship, depression, illegal drug usage and tobacco use    Objective:    BP 104/70   Pulse 82   Wt 179 lb 3.2 oz (81.3 kg)   LMP 07/03/2016   BMI 30.76 kg/m  General Appearance:    Alert, cooperative, no distress, appears stated age  Head:    Normocephalic, without obvious abnormality, atraumatic  Eyes:    PERRL, conjunctiva/corneas clear, EOM's intact, fundi    benign, both eyes  Ears:    Normal TM's and external ear canals, both ears  Nose:   Nares normal, septum midline, mucosa normal, no drainage    or sinus tenderness  Throat:   Lips, mucosa, and tongue normal; teeth and gums normal  Neck:   Supple, symmetrical, trachea midline, no adenopathy;   thyroid:  no enlargement/tenderness/nodules; no carotid   bruit or JVD  Back:     Symmetric, no curvature, ROM normal, no CVA tenderness  Lungs:     Clear to auscultation bilaterally, respirations unlabored  Chest Wall:    No tenderness or deformity   Heart:    Regular rate and rhythm, S1 and S2 normal, no murmur, rub   or gallop  Breast Exam:    No tenderness, masses, or nipple abnormality  Abdomen:     Soft, non-tender, bowel sounds active all four quadrants,    no masses, no organomegaly  Genitalia:    Normal female without lesion, discharge or tenderness  Extremities:   Extremities normal, atraumatic, no cyanosis or edema  Pulses:   2+ and symmetric all extremities  Skin:   Skin color, texture, turgor normal, no rashes or lesions  Lymph nodes:   Cervical, supraclavicular, and axillary nodes normal  Neurologic:   CNII-XII intact, normal strength, sensation and reflexes    throughout      Lab Review Urine pregnancy testfor  Labs reviewed yes Radiologic studies reviewed yes  Assessment:    Pregnancy at 7575w3d weeks    History of Incompetent Cervix, and delivery at 23 weeks with cerclage in place   Plan:      Referred to MFM for Nuchal Fold Ultrasound and cervical ultrasound evaluation  Will need a repeat cerclage at 13 -14 weeks  Prenatal vitamins.  Counseling provided regarding continued use of seat belts, cessation of alcohol consumption, smoking or use of illicit drugs; infection precautions i.e., influenza/TDAP immunizations, toxoplasmosis,CMV, parvovirus, listeria and varicella; workplace safety, exercise during pregnancy; routine dental care, safe medications, sexual activity, hot tubs, saunas, pools, travel, caffeine use, fish and methlymercury, potential toxins, hair treatments, varicose veins Weight gain recommendations per IOM guidelines reviewed: underweight/BMI< 18.5--> gain 28 - 40 lbs; normal weight/BMI 18.5 - 24.9--> gain 25 - 35 lbs; overweight/BMI 25 - 29.9-->  gain 15 - 25 lbs; obese/BMI >30->gain  11 - 20 lbs Problem list reviewed and updated. FIRST/CF mutation testing/NIPT/QUAD SCREEN/fragile X/Ashkenazi Jewish population testing/Spinal muscular atrophy discussed: requested. Role of ultrasound in pregnancy discussed; fetal survey: requested. Amniocentesis discussed: not indicated. VBAC calculator score: VBAC consent form provided No orders of the defined types were placed in this encounter.  Orders Placed This Encounter  Procedures  . Culture, OB Urine  . [place] MFM Fetal Nuchal Translucency    Standing Status:   Future    Standing Expiration Date:   11/22/2017    Order Specific Question:   Reason for Exam (SYMPTOM  OR DIAGNOSIS REQUIRED)    Answer:   Patient desires first trimester screening    Order Specific Question:   Preferred imaging location?    Answer:   [facility]-Ultrasound  . TSH  . Hemoglobinopathy Evaluation  . Varicella Zoster Abs, IgG/IgM  . MaterniT21  plus Core+ESS+SCA, Blood    Order Specific Question:   Is the patient insulin dependent?    Answer:   No    Order Specific Question:   Please enter gestational age. This should be expressed as weeks AND days, i.e. 16w 6d. Enter weeks here. Enter days in next question.    Answer:   77    Order Specific Question:   Please enter gestational age. This should be expressed as weeks AND days, i.e. 16w 6d. Enter days here. Enter weeks in previous question.    Answer:   2    Order Specific Question:   How was gestational age calculated?    Answer:   LMP    Order Specific Question:   Please give the date of LMP OR Ultrasound OR Estimated date of delivery.    Answer:   07/03/2016    Order Specific Question:   Number of Fetuses (Type of Pregnancy):    Answer:   1    Order Specific Question:   Indications for performing the test? (please choose all that apply):    Answer:   Routine screening    Order Specific Question:   If this is a repeat specimen, please indicate the reason:    Answer:    Not indicated    Order Specific Question:   Please specify the patient's race: (C=White/Caucasion, B=Black, I=Native American, A=Asian, H=Hispanic, O=Other, U=Unknown)    Answer:   B    Order Specific Question:   Donor Egg - indicate if the egg was obtained from in vitro fertilization.    Answer:   N    Order Specific Question:   Age of Egg Donor.    Answer:   0    Order Specific Question:   Prior Down Syndrome/ONTD screening during current pregnancy.    Answer:   N    Order Specific Question:   Prior First Trimester Testing    Answer:   N    Order Specific Question:   Prior Second Trimester Testing    Answer:  N    Order Specific Question:   Family History of Neural Tube Defects    Answer:   N    Order Specific Question:   Prior Pregnancy with Down Syndrome    Answer:   N    Order Specific Question:   Please give the patient's weight (in pounds)    Answer:   179  . ToxASSURE Select 13 (MW), Urine  . Hemoglobin A1c  . Obstetric Panel, Including HIV  . Cystic Fibrosis Mutation 97    Follow up in 2 weeks. 50% of 20 min visit spent on counseling and coordination of care. Patient ID: Patsy Zaragoza, female   DOB: [birth year], 36 y.o.   MRN: [id number]

## 2016-09-23 LAB — CERVICOVAGINAL ANCILLARY ONLY
Bacterial vaginitis: NEGATIVE
CANDIDA VAGINITIS: NEGATIVE
CHLAMYDIA, DNA PROBE: NEGATIVE
NEISSERIA GONORRHEA: NEGATIVE
Trichomonas: NEGATIVE

## 2016-09-24 LAB — CYTOLOGY - PAP
Diagnosis: NEGATIVE
HPV: NOT DETECTED

## 2016-10-04 ENCOUNTER — Ambulatory Visit (HOSPITAL_COMMUNITY)
Admission: RE | Admit: 2016-10-04 | Discharge: 2016-10-04 | Disposition: A | Discharge status: Home / Self Care | Payer: Medicaid Other | Source: Ambulatory Visit | Attending: Obstetrics | Admitting: Obstetrics

## 2016-10-04 ENCOUNTER — Encounter (HOSPITAL_COMMUNITY): Payer: Self-pay

## 2016-10-04 DIAGNOSIS — O09511 Supervision of elderly primigravida, first trimester: Secondary | ICD-10-CM | POA: Insufficient documentation

## 2016-10-04 DIAGNOSIS — Z3490 Encounter for supervision of normal pregnancy, unspecified, unspecified trimester: Secondary | ICD-10-CM

## 2016-10-04 DIAGNOSIS — O09211 Supervision of pregnancy with history of pre-term labor, first trimester: Secondary | ICD-10-CM | POA: Diagnosis not present

## 2016-10-04 DIAGNOSIS — Z3A12 12 weeks gestation of pregnancy: Secondary | ICD-10-CM | POA: Insufficient documentation

## 2016-10-04 DIAGNOSIS — Z3682 Encounter for antenatal screening for nuchal translucency: Secondary | ICD-10-CM | POA: Insufficient documentation

## 2016-10-05 ENCOUNTER — Other Ambulatory Visit (HOSPITAL_COMMUNITY): Payer: Self-pay | Admitting: *Deleted

## 2016-10-05 DIAGNOSIS — O09522 Supervision of elderly multigravida, second trimester: Secondary | ICD-10-CM

## 2016-10-05 DIAGNOSIS — O3430 Maternal care for cervical incompetence, unspecified trimester: Secondary | ICD-10-CM

## 2016-10-06 ENCOUNTER — Ambulatory Visit (INDEPENDENT_AMBULATORY_CARE_PROVIDER_SITE_OTHER): Payer: Medicaid Other | Admitting: Obstetrics and Gynecology

## 2016-10-06 DIAGNOSIS — O09211 Supervision of pregnancy with history of pre-term labor, first trimester: Secondary | ICD-10-CM

## 2016-10-06 DIAGNOSIS — N883 Incompetence of cervix uteri: Secondary | ICD-10-CM

## 2016-10-06 DIAGNOSIS — Z8751 Personal history of pre-term labor: Secondary | ICD-10-CM

## 2016-10-06 DIAGNOSIS — Z8759 Personal history of other complications of pregnancy, childbirth and the puerperium: Secondary | ICD-10-CM | POA: Diagnosis not present

## 2016-10-06 DIAGNOSIS — O0991 Supervision of high risk pregnancy, unspecified, first trimester: Secondary | ICD-10-CM

## 2016-10-06 NOTE — Progress Notes (Signed)
Subjective:  Heather Little is a 36 y.o. [id number] at 3932w4d being seen today for ongoing prenatal care.  She is currently monitored for the following issues for this high-risk pregnancy and has Beta thalassemia trait; History of premature delivery; History of premature rupture of membranes; and Incompetency, cervical on her problem list.  Patient reports no complaints.  Contractions: Not present. Vag. Bleeding: None.  Movement: Present. Denies leaking of fluid.   The following portions of the patient's history were reviewed and updated as appropriate: allergies, current medications, past family history, past medical history, past social history, past surgical history and problem list. Problem list updated.  Objective:   Vitals:   10/06/16 1512  BP: 118/77  Pulse: 68  Weight: 176 lb (79.8 kg)    Fetal Status: Fetal Heart Rate (bpm): 152   Movement: Present     General:  Alert, oriented and cooperative. Patient is in no acute distress.  Skin: Skin is warm and dry. No rash noted.   Cardiovascular: Normal heart rate noted  Respiratory: Normal respiratory effort, no problems with respiration noted  Abdomen: Soft, gravid, appropriate for gestational age. Pain/Pressure: Absent     Pelvic:  Cervical exam deferred        Extremities: Normal range of motion.  Edema: None  Mental Status: Normal mood and affect. Normal behavior. Normal judgment and thought content.   Urinalysis:      Assessment and Plan:  Pregnancy: [id number] at 3032w4d  1. Supervision of high risk pregnancy in first trimester Prenatal labs drawn today Declines flu vaccine  2. History of premature delivery Proceeded by Incompetent cervical and rescue cerclage  3. History of premature rupture of membranes As noted above  4. Incompetency, cervical As noted above. MFM consult has been completed Cerclage placement vs serial CL's reviewed Pt has elected to have serial CL 17 OHP discussed pending CL at 16 weeks will start at 16  weeks  Preterm labor symptoms and general obstetric precautions including but not limited to vaginal bleeding, contractions, leaking of fluid and fetal movement were reviewed in detail with the patient. Please refer to After Visit Summary for other counseling recommendations.  No Follow-up on file.   Hermina StaggersMichael L Maynor Mwangi, MD

## 2016-10-06 NOTE — Patient Instructions (Signed)
First Trimester of Pregnancy  The first trimester of pregnancy is from week 1 until the end of week 12 (months 1 through 3). A week after a sperm fertilizes an egg, the egg will implant on the wall of the uterus. This embryo will begin to develop into a baby. Genes from you and your partner are forming the baby. The female genes determine whether the baby is a boy or a girl. At 6-8 weeks, the eyes and face are formed, and the heartbeat can be seen on ultrasound. At the end of 12 weeks, all the baby's organs are formed.   Now that you are pregnant, you will want to do everything you can to have a healthy baby. Two of the most important things are to get good prenatal care and to follow your health care provider's instructions. Prenatal care is all the medical care you receive before the baby's birth. This care will help prevent, find, and treat any problems during the pregnancy and childbirth.  BODY CHANGES  Your body goes through many changes during pregnancy. The changes vary from woman to woman.   · You may gain or lose a couple of pounds at first.  · You may feel sick to your stomach (nauseous) and throw up (vomit). If the vomiting is uncontrollable, call your health care provider.  · You may tire easily.  · You may develop headaches that can be relieved by medicines approved by your health care provider.  · You may urinate more often. Painful urination may mean you have a bladder infection.  · You may develop heartburn as a result of your pregnancy.  · You may develop constipation because certain hormones are causing the muscles that push waste through your intestines to slow down.  · You may develop hemorrhoids or swollen, bulging veins (varicose veins).  · Your breasts may begin to grow larger and become tender. Your nipples may stick out more, and the tissue that surrounds them (areola) may become darker.  · Your gums may bleed and may be sensitive to brushing and flossing.   · Dark spots or blotches (chloasma, mask of pregnancy) may develop on your face. This will likely fade after the baby is born.  · Your menstrual periods will stop.  · You may have a loss of appetite.  · You may develop cravings for certain kinds of food.  · You may have changes in your emotions from day to day, such as being excited to be pregnant or being concerned that something may go wrong with the pregnancy and baby.  · You may have more vivid and strange dreams.  · You may have changes in your hair. These can include thickening of your hair, rapid growth, and changes in texture. Some women also have hair loss during or after pregnancy, or hair that feels dry or thin. Your hair will most likely return to normal after your baby is born.  WHAT TO EXPECT AT YOUR PRENATAL VISITS  During a routine prenatal visit:  · You will be weighed to make sure you and the baby are growing normally.  · Your blood pressure will be taken.  · Your abdomen will be measured to track your baby's growth.  · The fetal heartbeat will be listened to starting around week 10 or 12 of your pregnancy.  · Test results from any previous visits will be discussed.  Your health care provider may ask you:  · How you are feeling.  · If you   are feeling the baby move.  · If you have had any abnormal symptoms, such as leaking fluid, bleeding, severe headaches, or abdominal cramping.  · If you are using any tobacco products, including cigarettes, chewing tobacco, and electronic cigarettes.  · If you have any questions.  Other tests that may be performed during your first trimester include:  · Blood tests to find your blood type and to check for the presence of any previous infections. They will also be used to check for low iron levels (anemia) and Rh antibodies. Later in the pregnancy, blood tests for diabetes will be done along with other tests if problems develop.  · Urine tests to check for infections, diabetes, or protein in the urine.   · An ultrasound to confirm the proper growth and development of the baby.  · An amniocentesis to check for possible genetic problems.  · Fetal screens for spina bifida and Down syndrome.  · You may need other tests to make sure you and the baby are doing well.  · HIV (human immunodeficiency virus) testing. Routine prenatal testing includes screening for HIV, unless you choose not to have this test.  HOME CARE INSTRUCTIONS   Medicines  · Follow your health care provider's instructions regarding medicine use. Specific medicines may be either safe or unsafe to take during pregnancy.  · Take your prenatal vitamins as directed.  · If you develop constipation, try taking a stool softener if your health care provider approves.  Diet  · Eat regular, well-balanced meals. Choose a variety of foods, such as meat or vegetable-based protein, fish, milk and low-fat dairy products, vegetables, fruits, and whole grain breads and cereals. Your health care provider will help you determine the amount of weight gain that is right for you.  · Avoid raw meat and uncooked cheese. These carry germs that can cause birth defects in the baby.  · Eating four or five small meals rather than three large meals a day may help relieve nausea and vomiting. If you start to feel nauseous, eating a few soda crackers can be helpful. Drinking liquids between meals instead of during meals also seems to help nausea and vomiting.  · If you develop constipation, eat more high-fiber foods, such as fresh vegetables or fruit and whole grains. Drink enough fluids to keep your urine clear or pale yellow.  Activity and Exercise  · Exercise only as directed by your health care provider. Exercising will help you:    Control your weight.    Stay in shape.    Be prepared for labor and delivery.  · Experiencing pain or cramping in the lower abdomen or low back is a good sign that you should stop exercising. Check with your health care provider  before continuing normal exercises.  · Try to avoid standing for long periods of time. Move your legs often if you must stand in one place for a long time.  · Avoid heavy lifting.  · Wear low-heeled shoes, and practice good posture.  · You may continue to have sex unless your health care provider directs you otherwise.  Relief of Pain or Discomfort  · Wear a good support bra for breast tenderness.    · Take warm sitz baths to soothe any pain or discomfort caused by hemorrhoids. Use hemorrhoid cream if your health care provider approves.    · Rest with your legs elevated if you have leg cramps or low back pain.  · If you develop varicose veins in your   legs, wear support hose. Elevate your feet for 15 minutes, 3-4 times a day. Limit salt in your diet.  Prenatal Care  · Schedule your prenatal visits by the twelfth week of pregnancy. They are usually scheduled monthly at first, then more often in the last 2 months before delivery.  · Write down your questions. Take them to your prenatal visits.  · Keep all your prenatal visits as directed by your health care provider.  Safety  · Wear your seat belt at all times when driving.  · Make a list of emergency phone numbers, including numbers for family, friends, the hospital, and police and fire departments.  General Tips  · Ask your health care provider for a referral to a local prenatal education class. Begin classes no later than at the beginning of month 6 of your pregnancy.  · Ask for help if you have counseling or nutritional needs during pregnancy. Your health care provider can offer advice or refer you to specialists for help with various needs.  · Do not use hot tubs, steam rooms, or saunas.  · Do not douche or use tampons or scented sanitary pads.  · Do not cross your legs for long periods of time.  · Avoid cat litter boxes and soil used by cats. These carry germs that can cause birth defects in the baby and possibly loss of the fetus by miscarriage or stillbirth.   · Avoid all smoking, herbs, alcohol, and medicines not prescribed by your health care provider. Chemicals in these affect the formation and growth of the baby.  · Do not use any tobacco products, including cigarettes, chewing tobacco, and electronic cigarettes. If you need help quitting, ask your health care provider. You may receive counseling support and other resources to help you quit.  · Schedule a dentist appointment. At home, brush your teeth with a soft toothbrush and be gentle when you floss.  SEEK MEDICAL CARE IF:   · You have dizziness.  · You have mild pelvic cramps, pelvic pressure, or nagging pain in the abdominal area.  · You have persistent nausea, vomiting, or diarrhea.  · You have a bad smelling vaginal discharge.  · You have pain with urination.  · You notice increased swelling in your face, hands, legs, or ankles.  SEEK IMMEDIATE MEDICAL CARE IF:   · You have a fever.  · You are leaking fluid from your vagina.  · You have spotting or bleeding from your vagina.  · You have severe abdominal cramping or pain.  · You have rapid weight gain or loss.  · You vomit blood or material that looks like coffee grounds.  · You are exposed to German measles and have never had them.  · You are exposed to fifth disease or chickenpox.  · You develop a severe headache.  · You have shortness of breath.  · You have any kind of trauma, such as from a fall or a car accident.     This information is not intended to replace advice given to you by your health care provider. Make sure you discuss any questions you have with your health care provider.     Document Released: 08/17/2001 Document Revised: 09/13/2014 Document Reviewed: 07/03/2013  Elsevier Interactive Patient Education ©2017 [employer]

## 2016-10-10 ENCOUNTER — Other Ambulatory Visit: Payer: Self-pay

## 2016-10-11 ENCOUNTER — Other Ambulatory Visit: Payer: Self-pay | Admitting: Obstetrics

## 2016-10-11 DIAGNOSIS — N390 Urinary tract infection, site not specified: Principal | ICD-10-CM

## 2016-10-11 DIAGNOSIS — B952 Enterococcus as the cause of diseases classified elsewhere: Secondary | ICD-10-CM

## 2016-10-11 LAB — CULTURE, OB URINE

## 2016-10-11 LAB — URINE CULTURE, OB REFLEX

## 2016-10-11 MED ORDER — AMOXICILLIN-POT CLAVULANATE 875-125 MG PO TABS: 1.0000 | ORAL_TABLET | Freq: Two times a day (BID) | ORAL | 0 refills | Status: DC

## 2016-10-12 ENCOUNTER — Telehealth (HOSPITAL_COMMUNITY): Payer: Self-pay | Admitting: Genetics

## 2016-10-12 NOTE — Telephone Encounter (Signed)
Called Benard RinkNenneh Rudell to discuss her prenatal cell free DNA test results.  Mrs. Benard RinkNenneh Schomer had Panorama testing through [facility].  Testing was offered because of AMA.   The patient was identified by name and DOB.  We reviewed that these are within normal limits, showing a less than 1 in 10,000 risk for trisomies 21, 18 and 13, and monosomy X (Turner syndrome).  In addition, the risk for triploidy and sex chromosome trisomies (47,XXX and 47,XXY) was also low risk. We reviewed that this testing identifies > 99% of pregnancies with trisomy 2921, trisomy 3913, sex chromosome trisomies (47,XXX and 47,XXY), and triploidy. The detection rate for trisomy 18 is 96%.  The detection rate for monosomy X is ~92%.  The false positive rate is <0.1% for all conditions. Testing was also consistent with female fetal sex.  The patient did wish to know fetal sex.  She understands that this testing does not identify all genetic conditions.  All questions were answered to her satisfaction, she was encouraged to call with additional questions or concerns.  Mady Gemmaaragh Verlinda Slotnick, MS Certified Genetic Counselor

## 2016-10-13 ENCOUNTER — Other Ambulatory Visit: Payer: Self-pay

## 2016-10-14 LAB — OBSTETRIC PANEL, INCLUDING HIV
ANTIBODY SCREEN: NEGATIVE
BASOS ABS: 0 10*3/uL (ref 0.0–0.2)
BASOS: 0 %
EOS (ABSOLUTE): 0 10*3/uL (ref 0.0–0.4)
Eos: 1 %
HEMATOCRIT: 33.8 % — AB (ref 34.0–46.6)
HIV SCREEN 4TH GENERATION: NONREACTIVE
Hemoglobin: 10.8 g/dL — ABNORMAL LOW (ref 11.1–15.9)
Hepatitis B Surface Ag: NEGATIVE
IMMATURE GRANS (ABS): 0 10*3/uL (ref 0.0–0.1)
Immature Granulocytes: 0 %
LYMPHS: 25 %
Lymphocytes Absolute: 1.5 10*3/uL (ref 0.7–3.1)
MCH: 21.6 pg — ABNORMAL LOW (ref 26.6–33.0)
MCHC: 32 g/dL (ref 31.5–35.7)
MCV: 68 fL — ABNORMAL LOW (ref 79–97)
MONOCYTES: 7 %
MONOS ABS: 0.4 10*3/uL (ref 0.1–0.9)
Neutrophils Absolute: 4.2 10*3/uL (ref 1.4–7.0)
Neutrophils: 67 %
PLATELETS: 240 10*3/uL (ref 150–379)
RBC: 5.01 x10E6/uL (ref 3.77–5.28)
RDW: 17.3 % — AB (ref 12.3–15.4)
RPR Ser Ql: NONREACTIVE
Rh Factor: POSITIVE
Rubella Antibodies, IGG: 17.8 index (ref >0.99)
WBC: 6.2 10*3/uL (ref 3.4–10.8)

## 2016-10-14 LAB — HEMOGLOBINOPATHY EVALUATION
FERRITIN: 64 ng/mL (ref 15–150)
HGB A: 95.7 % — AB (ref 96.4–98.8)
HGB SOLUBILITY: NEGATIVE
Hgb A2 Quant: 4.3 % — ABNORMAL HIGH (ref 1.8–3.2)
Hgb C: 0 %
Hgb F Quant: 0 % (ref 0.0–2.0)
Hgb S: 0 %

## 2016-10-14 LAB — VARICELLA ZOSTER ABS, IGG/IGM: VARICELLA: 717 {index} (ref >165)

## 2016-10-14 LAB — ALPHA-THALASSEMIA

## 2016-11-01 ENCOUNTER — Ambulatory Visit (INDEPENDENT_AMBULATORY_CARE_PROVIDER_SITE_OTHER): Payer: Medicaid Other | Admitting: Obstetrics and Gynecology

## 2016-11-01 ENCOUNTER — Encounter: Payer: Self-pay | Admitting: Obstetrics and Gynecology

## 2016-11-01 ENCOUNTER — Encounter: Payer: Self-pay | Admitting: *Deleted

## 2016-11-01 ENCOUNTER — Encounter (HOSPITAL_COMMUNITY): Payer: Self-pay

## 2016-11-01 ENCOUNTER — Ambulatory Visit (HOSPITAL_COMMUNITY)
Admission: RE | Admit: 2016-11-01 | Discharge: 2016-11-01 | Disposition: A | Discharge status: Home / Self Care | Payer: Medicaid Other | Source: Ambulatory Visit | Attending: Obstetrics | Admitting: Obstetrics

## 2016-11-01 ENCOUNTER — Other Ambulatory Visit (HOSPITAL_COMMUNITY): Payer: Self-pay | Admitting: Maternal and Fetal Medicine

## 2016-11-01 VITALS — BP 111/75 | HR 84 | Wt 176.0 lb

## 2016-11-01 DIAGNOSIS — N883 Incompetence of cervix uteri: Secondary | ICD-10-CM | POA: Diagnosis not present

## 2016-11-01 DIAGNOSIS — O3430 Maternal care for cervical incompetence, unspecified trimester: Secondary | ICD-10-CM

## 2016-11-01 DIAGNOSIS — O09522 Supervision of elderly multigravida, second trimester: Secondary | ICD-10-CM | POA: Insufficient documentation

## 2016-11-01 DIAGNOSIS — O09212 Supervision of pregnancy with history of pre-term labor, second trimester: Secondary | ICD-10-CM

## 2016-11-01 DIAGNOSIS — O09219 Supervision of pregnancy with history of pre-term labor, unspecified trimester: Secondary | ICD-10-CM

## 2016-11-01 DIAGNOSIS — O09899 Supervision of other high risk pregnancies, unspecified trimester: Secondary | ICD-10-CM

## 2016-11-01 DIAGNOSIS — Z3A16 16 weeks gestation of pregnancy: Secondary | ICD-10-CM

## 2016-11-01 DIAGNOSIS — D563 Thalassemia minor: Secondary | ICD-10-CM

## 2016-11-01 DIAGNOSIS — O09529 Supervision of elderly multigravida, unspecified trimester: Secondary | ICD-10-CM | POA: Insufficient documentation

## 2016-11-01 DIAGNOSIS — Z8759 Personal history of other complications of pregnancy, childbirth and the puerperium: Secondary | ICD-10-CM

## 2016-11-01 DIAGNOSIS — Z8751 Personal history of pre-term labor: Secondary | ICD-10-CM

## 2016-11-01 MED ORDER — HYDROXYPROGESTERONE CAPROATE 250 MG/ML IM OIL
250.0000 mg | TOPICAL_OIL | Freq: Once | INTRAMUSCULAR | Status: AC
  Administered 2016-11-01: 250 mg via INTRAMUSCULAR

## 2016-11-01 NOTE — Patient Instructions (Signed)
Second Trimester of Pregnancy The second trimester is from week 13 through week 28 (months 4 through 6). The second trimester is often a time when you feel your best. Your body has also adjusted to being pregnant, and you begin to feel better physically. Usually, morning sickness has lessened or quit completely, you may have more energy, and you may have an increase in appetite. The second trimester is also a time when the fetus is growing rapidly. At the end of the sixth month, the fetus is about 9 inches long and weighs about 1 pounds. You will likely begin to feel the baby move (quickening) between 18 and 20 weeks of the pregnancy. Body changes during your second trimester Your body continues to go through many changes during your second trimester. The changes vary from woman to woman.  Your weight will continue to increase. You will notice your lower abdomen bulging out.  You may begin to get stretch marks on your hips, abdomen, and breasts.  You may develop headaches that can be relieved by medicines. The medicines should be approved by your health care provider.  You may urinate more often because the fetus is pressing on your bladder.  You may develop or continue to have heartburn as a result of your pregnancy.  You may develop constipation because certain hormones are causing the muscles that push waste through your intestines to slow down.  You may develop hemorrhoids or swollen, bulging veins (varicose veins).  You may have back pain. This is caused by:  Weight gain.  Pregnancy hormones that are relaxing the joints in your pelvis.  A shift in weight and the muscles that support your balance.  Your breasts will continue to grow and they will continue to become tender.  Your gums may bleed and may be sensitive to brushing and flossing.  Dark spots or blotches (chloasma, mask of pregnancy) may develop on your face. This will likely fade after the baby is born.  A dark line  from your belly button to the pubic area (linea nigra) may appear. This will likely fade after the baby is born.  You may have changes in your hair. These can include thickening of your hair, rapid growth, and changes in texture. Some women also have hair loss during or after pregnancy, or hair that feels dry or thin. Your hair will most likely return to normal after your baby is born. What to expect at prenatal visits During a routine prenatal visit:  You will be weighed to make sure you and the fetus are growing normally.  Your blood pressure will be taken.  Your abdomen will be measured to track your baby's growth.  The fetal heartbeat will be listened to.  Any test results from the previous visit will be discussed. Your health care provider may ask you:  How you are feeling.  If you are feeling the baby move.  If you have had any abnormal symptoms, such as leaking fluid, bleeding, severe headaches, or abdominal cramping.  If you are using any tobacco products, including cigarettes, chewing tobacco, and electronic cigarettes.  If you have any questions. Other tests that may be performed during your second trimester include:  Blood tests that check for:  Low iron levels (anemia).  Gestational diabetes (between 24 and 28 weeks).  Rh antibodies. This is to check for a protein on red blood cells (Rh factor).  Urine tests to check for infections, diabetes, or protein in the urine.  An ultrasound to   confirm the proper growth and development of the baby.  An amniocentesis to check for possible genetic problems.  Fetal screens for spina bifida and Down syndrome.  HIV (human immunodeficiency virus) testing. Routine prenatal testing includes screening for HIV, unless you choose not to have this test. Follow these instructions at home: Eating and drinking  Continue to eat regular, healthy meals.  Avoid raw meat, uncooked cheese, cat litter boxes, and soil used by cats. These  carry germs that can cause birth defects in the baby.  Take your prenatal vitamins.  Take 1500-2000 mg of calcium daily starting at the 20th week of pregnancy until you deliver your baby.  If you develop constipation:  Take over-the-counter or prescription medicines.  Drink enough fluid to keep your urine clear or pale yellow.  Eat foods that are high in fiber, such as fresh fruits and vegetables, whole grains, and beans.  Limit foods that are high in fat and processed sugars, such as fried and sweet foods. Activity  Exercise only as directed by your health care provider. Experiencing uterine cramps is a good sign to stop exercising.  Avoid heavy lifting, wear low heel shoes, and practice good posture.  Wear your seat belt at all times when driving.  Rest with your legs elevated if you have leg cramps or low back pain.  Wear a good support bra for breast tenderness.  Do not use hot tubs, steam rooms, or saunas. Lifestyle  Avoid all smoking, herbs, alcohol, and unprescribed drugs. These chemicals affect the formation and growth of the baby.  Do not use any products that contain nicotine or tobacco, such as cigarettes and e-cigarettes. If you need help quitting, ask your health care provider.  A sexual relationship may be continued unless your health care provider directs you otherwise. General instructions  Follow your health care provider's instructions regarding medicine use. There are medicines that are either safe or unsafe to take during pregnancy.  Take warm sitz baths to soothe any pain or discomfort caused by hemorrhoids. Use hemorrhoid cream if your health care provider approves.  If you develop varicose veins, wear support hose. Elevate your feet for 15 minutes, 3-4 times a day. Limit salt in your diet.  Visit your dentist if you have not gone yet during your pregnancy. Use a soft toothbrush to brush your teeth and be gentle when you floss.  Keep all follow-up  prenatal visits as told by your health care provider. This is important. Contact a health care provider if:  You have dizziness.  You have mild pelvic cramps, pelvic pressure, or nagging pain in the abdominal area.  You have persistent nausea, vomiting, or diarrhea.  You have a bad smelling vaginal discharge.  You have pain with urination. Get help right away if:  You have a fever.  You are leaking fluid from your vagina.  You have spotting or bleeding from your vagina.  You have severe abdominal cramping or pain.  You have rapid weight gain or weight loss.  You have shortness of breath with chest pain.  You notice sudden or extreme swelling of your face, hands, ankles, feet, or legs.  You have not felt your baby move in over an hour.  You have severe headaches that do not go away with medicine.  You have vision changes. Summary  The second trimester is from week 13 through week 28 (months 4 through 6). It is also a time when the fetus is growing rapidly.  Your body goes   through many changes during pregnancy. The changes vary from woman to woman.  Avoid all smoking, herbs, alcohol, and unprescribed drugs. These chemicals affect the formation and growth your baby.  Do not use any tobacco products, such as cigarettes, chewing tobacco, and e-cigarettes. If you need help quitting, ask your health care provider.  Contact your health care provider if you have any questions. Keep all prenatal visits as told by your health care provider. This is important. This information is not intended to replace advice given to you by your health care provider. Make sure you discuss any questions you have with your health care provider. Document Released: 08/17/2001 Document Revised: 01/29/2016 Document Reviewed: 10/24/2012 Elsevier Interactive Patient Education  2017 Elsevier Inc.  

## 2016-11-01 NOTE — Progress Notes (Signed)
Subjective:  Benard Rinkenneh Bernales is a 36 y.o. [id number] at 811w2d being seen today for ongoing prenatal care.  She is currently monitored for the following issues for this high-risk pregnancy and has Beta thalassemia trait; History of premature delivery; History of premature rupture of membranes; Incompetency, cervical; and Encounter for supervision of high-risk pregnancy with elderly multigravida on her problem list.  Patient reports no complaints.  Contractions: Not present. Vag. Bleeding: None.  Movement: Present. Denies leaking of fluid.   The following portions of the patient's history were reviewed and updated as appropriate: allergies, current medications, past family history, past medical history, past social history, past surgical history and problem list. Problem list updated.  Objective:   Vitals:   11/01/16 0901  BP: 111/75  Pulse: 84  Weight: 176 lb (79.8 kg)    Fetal Status:     Movement: Present     General:  Alert, oriented and cooperative. Patient is in no acute distress.  Skin: Skin is warm and dry. No rash noted.   Cardiovascular: Normal heart rate noted  Respiratory: Normal respiratory effort, no problems with respiration noted  Abdomen: Soft, gravid, appropriate for gestational age. Pain/Pressure: Absent     Pelvic:  Cervical exam deferred        Extremities: Normal range of motion.  Edema: None  Mental Status: Normal mood and affect. Normal behavior. Normal judgment and thought content.   Urinalysis:      Assessment and Plan:  Pregnancy: [id number] at 271w2d  1. Encounter for supervision of high-risk pregnancy with elderly multigravida   2. History of premature rupture of membranes D/T IC  3. History of premature delivery D/T IC  4. Beta thalassemia trait Stable Continue with Folic acid   5. Incompetency, cervical CL today 3.2 cm Discussed management options with pt, including cerclage, q 2 week CL with progesterone therapy Pt desires to continue with q 2 week  CL and start 17 OHP today til 36 weeks  Preterm labor symptoms and general obstetric precautions including but not limited to vaginal bleeding, contractions, leaking of fluid and fetal movement were reviewed in detail with the patient. Please refer to After Visit Summary for other counseling recommendations.  No Follow-up on file.   Hermina StaggersMichael L Dotsie Gillette, MD

## 2016-11-01 NOTE — Addendum Note (Signed)
Addended by: Dalphine HandingGARDNER, Antario Yasuda L on: 11/01/2016 09:45 AM   Modules accepted: Orders

## 2016-11-01 NOTE — Progress Notes (Signed)
CL u/s completed today. 32 mm no funneling. Next CL already scheduled in 2 wks. Pt declines 17p.

## 2016-11-08 ENCOUNTER — Ambulatory Visit: Payer: Medicaid Other | Admitting: Obstetrics

## 2016-11-08 DIAGNOSIS — O09529 Supervision of elderly multigravida, unspecified trimester: Secondary | ICD-10-CM

## 2016-11-08 MED ORDER — HYDROXYPROGESTERONE CAPROATE 250 MG/ML IM OIL
250.0000 mg | TOPICAL_OIL | Freq: Once | INTRAMUSCULAR | Status: AC
  Administered 2016-11-08: 250 mg via INTRAMUSCULAR

## 2016-11-08 NOTE — Progress Notes (Signed)
Patient presents for 17P Injection. [place] Patient tolersated well. Administrations This Visit    hydroxyprogesterone caproate (MAKENA) 250 mg/mL injection 250 mg    Admin Date 11/08/2016 Action Given Dose 250 mg Route Intramuscular Administered By Maretta Beesarol J Seryna Marek, RMA

## 2016-11-15 ENCOUNTER — Ambulatory Visit (HOSPITAL_COMMUNITY)
Admission: RE | Admit: 2016-11-15 | Discharge: 2016-11-15 | Disposition: A | Discharge status: Home / Self Care | Payer: Medicaid Other | Source: Ambulatory Visit | Attending: Obstetrics | Admitting: Obstetrics

## 2016-11-15 ENCOUNTER — Ambulatory Visit: Payer: Medicaid Other

## 2016-11-15 ENCOUNTER — Encounter (HOSPITAL_COMMUNITY): Payer: Self-pay

## 2016-11-15 ENCOUNTER — Other Ambulatory Visit (HOSPITAL_COMMUNITY): Payer: Self-pay | Admitting: Maternal and Fetal Medicine

## 2016-11-15 VITALS — BP 112/58 | HR 74 | Wt 180.2 lb

## 2016-11-15 DIAGNOSIS — O3430 Maternal care for cervical incompetence, unspecified trimester: Secondary | ICD-10-CM

## 2016-11-15 DIAGNOSIS — O3432 Maternal care for cervical incompetence, second trimester: Secondary | ICD-10-CM

## 2016-11-15 DIAGNOSIS — O09522 Supervision of elderly multigravida, second trimester: Secondary | ICD-10-CM | POA: Diagnosis not present

## 2016-11-15 DIAGNOSIS — Z3A18 18 weeks gestation of pregnancy: Secondary | ICD-10-CM

## 2016-11-15 DIAGNOSIS — O09212 Supervision of pregnancy with history of pre-term labor, second trimester: Secondary | ICD-10-CM | POA: Diagnosis not present

## 2016-11-15 DIAGNOSIS — Z3689 Encounter for other specified antenatal screening: Secondary | ICD-10-CM

## 2016-11-15 DIAGNOSIS — Z363 Encounter for antenatal screening for malformations: Secondary | ICD-10-CM | POA: Diagnosis not present

## 2016-11-15 DIAGNOSIS — Z8751 Personal history of pre-term labor: Secondary | ICD-10-CM

## 2016-11-15 MED ORDER — HYDROXYPROGESTERONE CAPROATE 250 MG/ML IM OIL
250.0000 mg | TOPICAL_OIL | Freq: Once | INTRAMUSCULAR | Status: AC
  Administered 2016-11-15: 250 mg via INTRAMUSCULAR

## 2016-11-15 NOTE — Progress Notes (Signed)
Patient presents for 17P Injection. Injection given on RUOQ. Tolerated well. Administrations This Visit    hydroxyprogesterone caproate (MAKENA) 250 mg/mL injection 250 mg    Admin Date 11/15/2016 Action Given Dose 250 mg Route Intramuscular Administered By Maretta Beesarol J Marcina Kinnison, RMA

## 2016-11-29 ENCOUNTER — Ambulatory Visit (HOSPITAL_COMMUNITY)
Admission: RE | Admit: 2016-11-29 | Discharge: 2016-11-29 | Disposition: A | Discharge status: Home / Self Care | Payer: Medicaid Other | Source: Ambulatory Visit | Attending: Obstetrics | Admitting: Obstetrics

## 2016-11-29 ENCOUNTER — Ambulatory Visit (INDEPENDENT_AMBULATORY_CARE_PROVIDER_SITE_OTHER): Payer: Medicaid Other | Admitting: Obstetrics and Gynecology

## 2016-11-29 ENCOUNTER — Encounter (HOSPITAL_COMMUNITY): Payer: Self-pay

## 2016-11-29 VITALS — BP 107/71 | HR 83 | Wt 183.0 lb

## 2016-11-29 DIAGNOSIS — O3432 Maternal care for cervical incompetence, second trimester: Secondary | ICD-10-CM | POA: Insufficient documentation

## 2016-11-29 DIAGNOSIS — Z3A20 20 weeks gestation of pregnancy: Secondary | ICD-10-CM | POA: Diagnosis not present

## 2016-11-29 DIAGNOSIS — O09212 Supervision of pregnancy with history of pre-term labor, second trimester: Secondary | ICD-10-CM

## 2016-11-29 DIAGNOSIS — O09211 Supervision of pregnancy with history of pre-term labor, first trimester: Secondary | ICD-10-CM | POA: Insufficient documentation

## 2016-11-29 DIAGNOSIS — Z8751 Personal history of pre-term labor: Secondary | ICD-10-CM

## 2016-11-29 DIAGNOSIS — N883 Incompetence of cervix uteri: Secondary | ICD-10-CM

## 2016-11-29 DIAGNOSIS — O09522 Supervision of elderly multigravida, second trimester: Secondary | ICD-10-CM | POA: Insufficient documentation

## 2016-11-29 DIAGNOSIS — O09529 Supervision of elderly multigravida, unspecified trimester: Secondary | ICD-10-CM

## 2016-11-29 DIAGNOSIS — O3430 Maternal care for cervical incompetence, unspecified trimester: Secondary | ICD-10-CM

## 2016-11-29 MED ORDER — HYDROXYPROGESTERONE CAPROATE 250 MG/ML IM OIL
250.0000 mg | TOPICAL_OIL | INTRAMUSCULAR | Status: AC
  Administered 2016-11-29 – 2017-03-10 (×15): 250 mg via INTRAMUSCULAR

## 2016-11-29 NOTE — Progress Notes (Signed)
   PRENATAL VISIT NOTE  Subjective:  Heather Little is a 36 y.o. [id number] at 7214w2d being seen today for ongoing prenatal care.  She is currently monitored for the following issues for this high-risk pregnancy and has Beta thalassemia trait; History of premature delivery; History of premature rupture of membranes; Incompetency, cervical; and Encounter for supervision of high-risk pregnancy with elderly multigravida on her problem list.  Patient reports pain at 17-P injection site.  Contractions: Not present. Vag. Bleeding: None.  Movement: Present. Denies leaking of fluid.   The following portions of the patient's history were reviewed and updated as appropriate: allergies, current medications, past family history, past medical history, past social history, past surgical history and problem list. Problem list updated.  Objective:   Vitals:   11/29/16 0839  BP: 107/71  Pulse: 83  Weight: 183 lb (83 kg)    Fetal Status: Fetal Heart Rate (bpm): 155 Fundal Height: 20 cm Movement: Present     General:  Alert, oriented and cooperative. Patient is in no acute distress.  Skin: Skin is warm and dry. No rash noted.   Cardiovascular: Normal heart rate noted  Respiratory: Normal respiratory effort, no problems with respiration noted  Abdomen: Soft, gravid, appropriate for gestational age. Pain/Pressure: Absent     Pelvic:  Cervical exam deferred        Extremities: Normal range of motion.     Mental Status: Normal mood and affect. Normal behavior. Normal judgment and thought content.   Assessment and Plan:  Pregnancy: [id number] at 7914w2d  1. History of premature delivery Secondary to incompetent cervix currently receiving 17-P weekly  2. Encounter for supervision of high-risk pregnancy with elderly multigravida Patient is doing well without complaints other than injection site discomfort Reviewed anatomy report with the patient. Follow up anatomy scheduled - US MFM OB FOLLOW UP; Future -  hydroxyprogesterone caproate (MAKENA) 250 mg/mL injection 250 mg; Inject 1 mL (250 mg total) into the muscle once a week.  3. Incompetency, cervical Continue weekly 17-P Due to pain at injection site patient desires to discontinue. She was under the impression that 17-P will be discontinued at 26 weeks I encouraged her to continue taking the injections. Reviewed the benefits in light of her ob history Patient agrees to continue for now but has plans to discontinue after 26 weeks  General obstetric precautions including but not limited to vaginal bleeding, contractions, leaking of fluid and fetal movement were reviewed in detail with the patient. Please refer to After Visit Summary for other counseling recommendations.  Return in about 4 weeks (around 12/27/2016) for ROB, weekly for 17-P.   Catalina AntiguaPeggy Dilyn Smiles, MD

## 2016-11-30 ENCOUNTER — Other Ambulatory Visit (HOSPITAL_COMMUNITY): Payer: Self-pay | Admitting: *Deleted

## 2016-11-30 DIAGNOSIS — O09219 Supervision of pregnancy with history of pre-term labor, unspecified trimester: Secondary | ICD-10-CM

## 2016-12-02 ENCOUNTER — Other Ambulatory Visit (HOSPITAL_COMMUNITY): Payer: Self-pay | Admitting: *Deleted

## 2016-12-02 ENCOUNTER — Ambulatory Visit (HOSPITAL_COMMUNITY): Payer: Medicaid Other

## 2016-12-02 ENCOUNTER — Ambulatory Visit (HOSPITAL_COMMUNITY)
Admission: RE | Admit: 2016-12-02 | Discharge: 2016-12-02 | Disposition: A | Discharge status: Home / Self Care | Payer: Medicaid Other | Source: Ambulatory Visit | Attending: Obstetrics | Admitting: Obstetrics

## 2016-12-02 ENCOUNTER — Encounter (HOSPITAL_COMMUNITY): Payer: Self-pay

## 2016-12-02 DIAGNOSIS — O09212 Supervision of pregnancy with history of pre-term labor, second trimester: Secondary | ICD-10-CM | POA: Insufficient documentation

## 2016-12-02 DIAGNOSIS — O09522 Supervision of elderly multigravida, second trimester: Secondary | ICD-10-CM | POA: Insufficient documentation

## 2016-12-02 DIAGNOSIS — Z3A20 20 weeks gestation of pregnancy: Secondary | ICD-10-CM | POA: Diagnosis not present

## 2016-12-02 DIAGNOSIS — O3432 Maternal care for cervical incompetence, second trimester: Secondary | ICD-10-CM | POA: Diagnosis not present

## 2016-12-02 DIAGNOSIS — O09219 Supervision of pregnancy with history of pre-term labor, unspecified trimester: Secondary | ICD-10-CM

## 2016-12-06 ENCOUNTER — Encounter (HOSPITAL_COMMUNITY): Payer: Self-pay

## 2016-12-06 ENCOUNTER — Ambulatory Visit (INDEPENDENT_AMBULATORY_CARE_PROVIDER_SITE_OTHER): Payer: Medicaid Other

## 2016-12-06 ENCOUNTER — Other Ambulatory Visit: Payer: Self-pay | Admitting: Obstetrics and Gynecology

## 2016-12-06 ENCOUNTER — Ambulatory Visit: Payer: Medicaid Other

## 2016-12-06 ENCOUNTER — Ambulatory Visit (HOSPITAL_COMMUNITY)
Admission: RE | Admit: 2016-12-06 | Discharge: 2016-12-06 | Disposition: A | Discharge status: Home / Self Care | Payer: Medicaid Other | Source: Ambulatory Visit | Attending: Obstetrics | Admitting: Obstetrics

## 2016-12-06 DIAGNOSIS — O09212 Supervision of pregnancy with history of pre-term labor, second trimester: Secondary | ICD-10-CM

## 2016-12-06 DIAGNOSIS — Z3A21 21 weeks gestation of pregnancy: Secondary | ICD-10-CM | POA: Diagnosis not present

## 2016-12-06 DIAGNOSIS — Z8751 Personal history of pre-term labor: Secondary | ICD-10-CM

## 2016-12-06 DIAGNOSIS — O3432 Maternal care for cervical incompetence, second trimester: Secondary | ICD-10-CM

## 2016-12-06 DIAGNOSIS — O09529 Supervision of elderly multigravida, unspecified trimester: Secondary | ICD-10-CM

## 2016-12-06 DIAGNOSIS — O09522 Supervision of elderly multigravida, second trimester: Secondary | ICD-10-CM

## 2016-12-06 NOTE — Progress Notes (Signed)
Pt presents for 17p. Injection given R upper outer quad w/o complaints.

## 2016-12-09 ENCOUNTER — Other Ambulatory Visit (HOSPITAL_COMMUNITY): Payer: Medicaid Other

## 2016-12-13 ENCOUNTER — Ambulatory Visit (HOSPITAL_COMMUNITY): Payer: Medicaid Other

## 2016-12-13 ENCOUNTER — Ambulatory Visit (INDEPENDENT_AMBULATORY_CARE_PROVIDER_SITE_OTHER): Payer: Medicaid Other

## 2016-12-13 DIAGNOSIS — O09212 Supervision of pregnancy with history of pre-term labor, second trimester: Secondary | ICD-10-CM | POA: Diagnosis not present

## 2016-12-13 DIAGNOSIS — Z8751 Personal history of pre-term labor: Secondary | ICD-10-CM

## 2016-12-13 NOTE — Progress Notes (Signed)
Nurse visit for 17p. 17p given L upper outer quad w/o difficulty.

## 2016-12-20 ENCOUNTER — Ambulatory Visit (HOSPITAL_COMMUNITY)
Admission: RE | Admit: 2016-12-20 | Discharge: 2016-12-20 | Disposition: A | Discharge status: Home / Self Care | Payer: Medicaid Other | Source: Ambulatory Visit | Attending: Obstetrics | Admitting: Obstetrics

## 2016-12-20 ENCOUNTER — Ambulatory Visit (INDEPENDENT_AMBULATORY_CARE_PROVIDER_SITE_OTHER): Payer: Medicaid Other | Admitting: *Deleted

## 2016-12-20 ENCOUNTER — Encounter (HOSPITAL_COMMUNITY): Payer: Self-pay

## 2016-12-20 VITALS — BP 120/77 | HR 85 | Wt 180.6 lb

## 2016-12-20 DIAGNOSIS — O3432 Maternal care for cervical incompetence, second trimester: Secondary | ICD-10-CM | POA: Insufficient documentation

## 2016-12-20 DIAGNOSIS — O09212 Supervision of pregnancy with history of pre-term labor, second trimester: Secondary | ICD-10-CM | POA: Insufficient documentation

## 2016-12-20 DIAGNOSIS — O09522 Supervision of elderly multigravida, second trimester: Secondary | ICD-10-CM | POA: Insufficient documentation

## 2016-12-20 DIAGNOSIS — Z3A23 23 weeks gestation of pregnancy: Secondary | ICD-10-CM | POA: Diagnosis not present

## 2016-12-20 DIAGNOSIS — Z315 Encounter for genetic counseling: Secondary | ICD-10-CM | POA: Insufficient documentation

## 2016-12-20 DIAGNOSIS — Z8751 Personal history of pre-term labor: Secondary | ICD-10-CM

## 2016-12-20 DIAGNOSIS — D563 Thalassemia minor: Secondary | ICD-10-CM

## 2016-12-20 DIAGNOSIS — O09219 Supervision of pregnancy with history of pre-term labor, unspecified trimester: Secondary | ICD-10-CM

## 2016-12-20 NOTE — Progress Notes (Signed)
Patient is in the office for her makena injection. She is having some itching at the injection sites. Advised she can use Benadryl.

## 2016-12-20 NOTE — Progress Notes (Signed)
Genetic Counseling  High-Risk Gestation Note  Appointment Date:  12/20/2016 Referred By: Shelly Bombard, MD Date of Birth:  Sep 01, 1981 Partner:  Delfina Redwood   Pregnancy History: [id number] Estimated Date of Delivery: 04/16/17 Estimated Gestational Age: 80w2dAttending: MGriffin Dakin MD   I met with Ms. Heather Davidsonfor genetic counseling given that routine screening through her OB provider indicated that she has beta thalassemia minor, and the patient's husband reportedly has sickle cell trait.   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  Patient reported that the father of the pregnancy has sickle cell trait  Reviewed autosomal recessive inheritance of hemoglobinopathies  1 in 4 (25%) risk for sickle beta-thalassemia in the current pregnancy given the available and reported information  Discussed options of screening / testing  Amniocentesis- declined  Newborn screening - patient prefers to pursue postnatal evaluation for baby for hemoglobinopathies  Patient will be 36years old at delivery; previous screening for aneuploidy ([place]) within normal limits   We reviewed that hemoglobin electrophoresis performed through her OB office identified her to have beta thalassemia minor (beta thalassemia trait). We reviewed both family histories in detail. The patient reported that the father of the pregnancy, all 3 of his brothers, and some maternal uncles have sickle cell trait. Consanguinity between the couple was denied.   Ms. NTyauna Lacazewas counseled that beta-thalassemia major is a genetic condition characterized by reduced synthesis of the beta subunit of hemoglobin (beta globin). We reviewed that hemoglobin is a protein in red blood cells that carries oxygen to the body's organs. There are multiple types of hemoglobin, and these are comprised of different subunits.  The primary adult hemoglobin, denoted as hemoglobin A (Hb  A) is made up of two alpha and two beta globin units. Individuals who have beta-thalassemia major have changes within the genes that code for beta globin. The resulting imbalance in the ratio of the alpha chains to the beta chains causes the underlying pathology. In beta-thalassemia, the alpha chains are produced in relative excess. Therefore, because of the absence of the beta chains with which to form a tetramer, the excess alpha chains will precipitate in the cell damaging the membrane and leading to premature RBC destruction. This results in hypochromic, microcytic anemia. The anemia is severe and typically leads to hepatosplenomegaly for individuals with beta thalassemia.  Because the beta chain is not used for hemoglobin production during fetal life, the onset of symptoms in beta-thalassemia is not until a few months of life. Beta thalassemia major is typically treated with regular transfusions and chelation therapy, aimed at reducing transfusion iron overload.  Without treatment, individuals with beta-thalassemia major have failure to thrive, feeding problems, and a shortened lifespan. Elevation of hemoglobin A2 (alpha2/delta2) occurs uniquely in beta-thalassemia carriers, and this is what is typically found on hemoglobin electrophoresis for beta thalassemia carriers. Continued production of the delta chains allows for tetramer formation between the alpha and delta chains. Beta-thalassemia produces a variable phenotype dependent upon the severity of the mutations.    Ms. TRuehlwas counseled that beta-thalassemia is inherited in an autosomal recessive manner, and occurs when both copies of the beta hemoglobin gene are changed. Typically, one abnormal beta globin gene is inherited from each parent. We discussed that beta thalassemia trait can be inherited with other beta globin chain variants, such as sickle cell trait (Hb AS) to also cause other variant hemoglobinopathies, such as sickle beta-thalassemia.   Beta-thalassemia minor occurs when an individual  has one altered copy of the beta globin gene and one typical working copy of the beta globin gene.  This is also referred to as being a "carrier" of beta-thalassemia.  Carriers of recessive conditions typically do not have symptoms related to the condition, because they still have one functioning copy of the gene, and thus some production of the typical protein coded for by that gene. We discussed that female and males have equal chances to be carriers of autosomal recessive conditions.    Given the recessive inheritance and the reported information, we discussed that the current pregnancy (and each pregnancy for the couple together) has an equal chance for the following: 1) 1 in 4 (25%) chance for sickle beta thalassemia, 2) 1 in 4 (25%) chance for sickle cell trait, 3) 1 in 4 (25%) chance for beta thalassemia trait, and 4) 1 in 4 (25%) chance to be neither affected nor a carrier. Thus, the current pregnancy as a 1 in 4 (25%) chance for sickle beta-thalassemia and a 3 in 4 (75%) chance to not be affected with sickle beta-thalassemia.   We discussed that in the case that both parents are identified to be carriers for hemoglobin variants, prenatal diagnosis via amniocentesis (after [redacted] weeks gestation) would be available, if desired. In the case of beta-thalassemia trait, molecular testing would first need to be performed to identify the causative mutation in the HBB gene prior to CVS or amniocentesis, given that >200 pathogenic variants have been identified in the HBB gene. Hemoglobin S (or sickle cell trait) is encoded by a specific point mutation (Glu6Val) in the HBB gene.  We discussed that HBB gene sequencing is available to Ms. Broady to assess for the causative gene change for her beta thalassemia trait. HBB sequencing reportedly identifies a causative mutation in 99% of individuals with beta thalassemia.  We discussed the risks, benefits, and limitations of  CVS and amniocentesis. We reviewed the associated 1 in 740-814 risk for complications from amniocentesis, including spontaneous pregnancy loss or preterm labor and delivery. The patient understands that targeted ultrasound in pregnancy would not be expected to see physical differences related to the presence or absence of a hemoglobinopathy. We also reviewed the availability of newborn screening in [place] for hemoglobinopathies. Ms. Lackie declined amniocentesis for prenatal diagnosis of hemoglobinopathy.   The family histories were otherwise found to be noncontributory for birth defects, intellectual disability, and known genetic conditions. Without further information regarding the provided family history, an accurate genetic risk cannot be calculated. Further genetic counseling is warranted if more information is obtained.  Ms. Burdett previously had noninvasive prenatal screening (NIPS)/prenatal cell free DNA for fetal aneuploidy given that she will be 36 years old at delivery, and this testing was within normal limits, showing a less than 1 in 10,000 risk for trisomies 27, 48 and 71, and monosomy X (Turner syndrome).  In addition, the risk for triploidy and sex chromosome trisomies (47,XXX and 47,XXY) was also low risk.  We reviewed that this testing identifies > 99% of pregnancies with trisomy 38, trisomy 68, sex chromosome trisomies (47,XXX and 47,XXY), and triploidy. The detection rate for trisomy 18 is 96%.  The detection rate for monosomy X is ~92%.  The false positive rate is <0.1% for all conditions. She understands that this testing does not identify all genetic conditions.  The patient is not interested in amniocentesis in the pregnancy.   Ms Annalyn Blecher was provided with written information regarding cystic fibrosis (CF) and spinal muscular  atrophy (SMA)  including the carrier frequency, availability of carrier screening and prenatal diagnosis if indicated.  In addition, we discussed  that CF and hemoglobinopathies are routinely screened for as part of the [place] newborn screening panel.  After further discussion, she declined screening for CF and SMA.  Ms. Tocarra Gassen denied exposure to environmental toxins or chemical agents. She denied the use of alcohol, tobacco or street drugs. She denied significant viral illnesses during the course of her pregnancy. Her medical and surgical histories were contributory for previous preterm delivery and is being managed by her OB provider regarding this history.    I counseled Ms. Aleene Little regarding the above risks and available options.  The approximate face-to-face time with the genetic counselor was 30 minutes.    Chipper Oman, MS Certified Genetic Counselor 12/20/2016

## 2016-12-27 ENCOUNTER — Ambulatory Visit (INDEPENDENT_AMBULATORY_CARE_PROVIDER_SITE_OTHER): Payer: Medicaid Other | Admitting: Obstetrics and Gynecology

## 2016-12-27 ENCOUNTER — Ambulatory Visit (HOSPITAL_COMMUNITY)
Admission: RE | Admit: 2016-12-27 | Discharge: 2016-12-27 | Disposition: A | Discharge status: Home / Self Care | Payer: Medicaid Other | Source: Ambulatory Visit | Attending: Obstetrics | Admitting: Obstetrics

## 2016-12-27 ENCOUNTER — Encounter (HOSPITAL_COMMUNITY): Payer: Self-pay

## 2016-12-27 VITALS — BP 117/75 | HR 87 | Wt 182.0 lb

## 2016-12-27 DIAGNOSIS — N883 Incompetence of cervix uteri: Secondary | ICD-10-CM | POA: Diagnosis not present

## 2016-12-27 DIAGNOSIS — O09212 Supervision of pregnancy with history of pre-term labor, second trimester: Secondary | ICD-10-CM | POA: Diagnosis not present

## 2016-12-27 DIAGNOSIS — O09522 Supervision of elderly multigravida, second trimester: Secondary | ICD-10-CM | POA: Insufficient documentation

## 2016-12-27 DIAGNOSIS — Z8751 Personal history of pre-term labor: Secondary | ICD-10-CM

## 2016-12-27 DIAGNOSIS — O09529 Supervision of elderly multigravida, unspecified trimester: Secondary | ICD-10-CM

## 2016-12-27 DIAGNOSIS — O3432 Maternal care for cervical incompetence, second trimester: Secondary | ICD-10-CM | POA: Diagnosis not present

## 2016-12-27 DIAGNOSIS — O09892 Supervision of other high risk pregnancies, second trimester: Secondary | ICD-10-CM | POA: Diagnosis not present

## 2016-12-27 DIAGNOSIS — Z3A24 24 weeks gestation of pregnancy: Secondary | ICD-10-CM | POA: Insufficient documentation

## 2016-12-27 DIAGNOSIS — O09899 Supervision of other high risk pregnancies, unspecified trimester: Secondary | ICD-10-CM | POA: Insufficient documentation

## 2016-12-27 DIAGNOSIS — O09219 Supervision of pregnancy with history of pre-term labor, unspecified trimester: Secondary | ICD-10-CM

## 2016-12-27 NOTE — Progress Notes (Addendum)
   PRENATAL VISIT NOTE  Subjective:  Heather Little is a 36 y.o. [id number] at [redacted]w[redacted]d being seen today for ongoing prenatal care.  She is currently monitored for the following issues for this high-risk pregnancy and has Beta thalassemia trait; History of premature delivery; History of premature rupture of membranes; Incompetency, cervical; Encounter for supervision of high-risk pregnancy with elderly multigravida; and Short interval between pregnancies affecting pregnancy, antepartum on her problem list.  Patient reports no complaints.  Contractions: Not present.  .  Movement: Present. Denies leaking of fluid.   The following portions of the patient's history were reviewed and updated as appropriate: allergies, current medications, past family history, past medical history, past social history, past surgical history and problem list. Problem list updated.  Objective:   Vitals:   12/27/16 0822  BP: 117/75  Pulse: 87  Weight: 182 lb (82.6 kg)    Fetal Status: Fetal Heart Rate (bpm): 145 Fundal Height: 24 cm Movement: Present     General:  Alert, oriented and cooperative. Patient is in no acute distress.  Skin: Skin is warm and dry. No rash noted.   Cardiovascular: Normal heart rate noted  Respiratory: Normal respiratory effort, no problems with respiration noted  Abdomen: Soft, gravid, appropriate for gestational age. Pain/Pressure: Absent     Pelvic:  Cervical exam deferred        Extremities: Normal range of motion.  Edema: None  Mental Status: Normal mood and affect. Normal behavior. Normal judgment and thought content.   Assessment and Plan:  Pregnancy: [id number] at [redacted]w[redacted]d  1. Encounter for supervision of high-risk pregnancy with elderly multigravida Patient is doing well without complaints Third trimester labs and glucola next visit  2. Incompetency, cervical Continue weekly 17-P Stable cervical length on 4/16. Follow up repeat cervical length today Patient questions the necessity  of weekly cervical length assessment given that it has remained stable for the past 3 weeks. Discussed the value of ultrasound for early detection of change and therefore early interventions which may delay/prevent another preterm delivery. Patient verbalized understanding and remains dissatisfied.  3. History of premature delivery Due to incompetent cervix  4. Short interval between pregnancies affecting pregnancy, antepartum   Preterm labor symptoms and general obstetric precautions including but not limited to vaginal bleeding, contractions, leaking of fluid and fetal movement were reviewed in detail with the patient. Please refer to After Visit Summary for other counseling recommendations.  Return in about 4 weeks (around 01/24/2017) for ROB, 2 hr glucola next visit.   Catalina Antigua, MD

## 2017-01-03 ENCOUNTER — Other Ambulatory Visit (HOSPITAL_COMMUNITY): Payer: Self-pay | Admitting: Obstetrics and Gynecology

## 2017-01-03 ENCOUNTER — Encounter (HOSPITAL_COMMUNITY): Payer: Self-pay

## 2017-01-03 ENCOUNTER — Ambulatory Visit (HOSPITAL_COMMUNITY)
Admission: RE | Admit: 2017-01-03 | Discharge: 2017-01-03 | Disposition: A | Discharge status: Home / Self Care | Payer: Medicaid Other | Source: Ambulatory Visit | Attending: Obstetrics | Admitting: Obstetrics

## 2017-01-03 ENCOUNTER — Ambulatory Visit (INDEPENDENT_AMBULATORY_CARE_PROVIDER_SITE_OTHER): Payer: Medicaid Other

## 2017-01-03 DIAGNOSIS — O09219 Supervision of pregnancy with history of pre-term labor, unspecified trimester: Secondary | ICD-10-CM

## 2017-01-03 DIAGNOSIS — Z3A25 25 weeks gestation of pregnancy: Secondary | ICD-10-CM

## 2017-01-03 DIAGNOSIS — O09522 Supervision of elderly multigravida, second trimester: Secondary | ICD-10-CM | POA: Diagnosis present

## 2017-01-03 DIAGNOSIS — O3432 Maternal care for cervical incompetence, second trimester: Secondary | ICD-10-CM | POA: Diagnosis present

## 2017-01-03 DIAGNOSIS — O09212 Supervision of pregnancy with history of pre-term labor, second trimester: Secondary | ICD-10-CM

## 2017-01-03 DIAGNOSIS — O0990 Supervision of high risk pregnancy, unspecified, unspecified trimester: Secondary | ICD-10-CM

## 2017-01-03 MED ORDER — BETAMETHASONE SOD PHOS & ACET 6 (3-3) MG/ML IJ SUSP
12.0000 mg | Freq: Once | INTRAMUSCULAR | Status: DC
  Filled 2017-01-03: qty 2

## 2017-01-03 NOTE — Progress Notes (Signed)
Patient is in the office for 17p injection, administered and pt tolerated well .Marland Kitchen Administrations This Visit    hydroxyprogesterone caproate (MAKENA) 250 mg/mL injection 250 mg    Admin Date 01/03/2017 Action Given Dose 250 mg Route Intramuscular Administered By Katrina Stack, RN

## 2017-01-04 ENCOUNTER — Ambulatory Visit (HOSPITAL_COMMUNITY)
Admission: RE | Admit: 2017-01-04 | Discharge: 2017-01-04 | Disposition: A | Discharge status: Home / Self Care | Payer: Medicaid Other | Source: Ambulatory Visit | Attending: Obstetrics | Admitting: Obstetrics

## 2017-01-04 DIAGNOSIS — O26879 Cervical shortening, unspecified trimester: Secondary | ICD-10-CM | POA: Insufficient documentation

## 2017-01-04 DIAGNOSIS — Z3A00 Weeks of gestation of pregnancy not specified: Secondary | ICD-10-CM | POA: Insufficient documentation

## 2017-01-04 MED ORDER — BETAMETHASONE SOD PHOS & ACET 6 (3-3) MG/ML IJ SUSP
12.0000 mg | Freq: Once | INTRAMUSCULAR | Status: DC
  Filled 2017-01-04: qty 2

## 2017-01-04 NOTE — ED Notes (Signed)
Pt here in [facility] for second BMZ injections.  BMZ given in RUQ glute IM.  Pt tolerated well.

## 2017-01-10 ENCOUNTER — Encounter (HOSPITAL_COMMUNITY): Payer: Self-pay

## 2017-01-10 ENCOUNTER — Ambulatory Visit (INDEPENDENT_AMBULATORY_CARE_PROVIDER_SITE_OTHER): Payer: Medicaid Other

## 2017-01-10 ENCOUNTER — Ambulatory Visit (HOSPITAL_COMMUNITY)
Admission: RE | Admit: 2017-01-10 | Discharge: 2017-01-10 | Disposition: A | Discharge status: Home / Self Care | Payer: Medicaid Other | Source: Ambulatory Visit | Attending: Obstetrics | Admitting: Obstetrics

## 2017-01-10 ENCOUNTER — Other Ambulatory Visit (HOSPITAL_COMMUNITY): Payer: Self-pay | Admitting: Obstetrics and Gynecology

## 2017-01-10 VITALS — BP 105/71 | HR 82 | Wt 182.0 lb

## 2017-01-10 DIAGNOSIS — Z3A26 26 weeks gestation of pregnancy: Secondary | ICD-10-CM | POA: Diagnosis not present

## 2017-01-10 DIAGNOSIS — O09219 Supervision of pregnancy with history of pre-term labor, unspecified trimester: Secondary | ICD-10-CM | POA: Diagnosis not present

## 2017-01-10 DIAGNOSIS — O09522 Supervision of elderly multigravida, second trimester: Secondary | ICD-10-CM

## 2017-01-10 DIAGNOSIS — O09212 Supervision of pregnancy with history of pre-term labor, second trimester: Secondary | ICD-10-CM

## 2017-01-10 DIAGNOSIS — Z8751 Personal history of pre-term labor: Secondary | ICD-10-CM

## 2017-01-10 DIAGNOSIS — O3430 Maternal care for cervical incompetence, unspecified trimester: Secondary | ICD-10-CM

## 2017-01-10 DIAGNOSIS — O3432 Maternal care for cervical incompetence, second trimester: Secondary | ICD-10-CM | POA: Diagnosis not present

## 2017-01-10 NOTE — Progress Notes (Signed)
Patient presents for 17P Injection. Given in RUOQ. Tolerated well.   Administrations This Visit    hydroxyprogesterone caproate (MAKENA) 250 mg/mL injection 250 mg    Admin Date 01/10/2017 Action Given Dose 250 mg Route Intramuscular Administered By Maretta BeesMcGlashan, Cristin Penaflor J, RMA

## 2017-01-11 ENCOUNTER — Other Ambulatory Visit (HOSPITAL_COMMUNITY): Payer: Self-pay | Admitting: *Deleted

## 2017-01-11 DIAGNOSIS — O26879 Cervical shortening, unspecified trimester: Secondary | ICD-10-CM

## 2017-01-17 ENCOUNTER — Ambulatory Visit (INDEPENDENT_AMBULATORY_CARE_PROVIDER_SITE_OTHER): Payer: Medicaid Other

## 2017-01-17 VITALS — BP 115/71 | HR 87 | Wt 185.0 lb

## 2017-01-17 DIAGNOSIS — O09522 Supervision of elderly multigravida, second trimester: Secondary | ICD-10-CM

## 2017-01-17 DIAGNOSIS — Z8751 Personal history of pre-term labor: Secondary | ICD-10-CM

## 2017-01-17 NOTE — Progress Notes (Signed)
Patient presents for 17P Injection. Given in LUOQ. Tolerated well. Administrations This Visit    hydroxyprogesterone caproate (MAKENA) 250 mg/mL injection 250 mg    Admin Date 01/17/2017 Action Given Dose 250 mg Route Intramuscular Administered By Maretta BeesMcGlashan, Jaiyon Wander J, RMA

## 2017-01-21 MED FILL — Betamethasone Sod Phosphate & Acetate Inj Susp 6 (3-3) MG/ML: INTRAMUSCULAR | Qty: 2 | Status: AC

## 2017-01-24 ENCOUNTER — Ambulatory Visit (HOSPITAL_COMMUNITY)
Admission: RE | Admit: 2017-01-24 | Discharge: 2017-01-24 | Disposition: A | Discharge status: Home / Self Care | Payer: Medicaid Other | Source: Ambulatory Visit | Attending: Obstetrics | Admitting: Obstetrics

## 2017-01-24 ENCOUNTER — Encounter (HOSPITAL_COMMUNITY): Payer: Self-pay

## 2017-01-24 ENCOUNTER — Ambulatory Visit (INDEPENDENT_AMBULATORY_CARE_PROVIDER_SITE_OTHER): Payer: Medicaid Other | Admitting: Obstetrics and Gynecology

## 2017-01-24 ENCOUNTER — Other Ambulatory Visit: Payer: Medicaid Other

## 2017-01-24 VITALS — BP 107/67 | HR 89 | Wt 185.3 lb

## 2017-01-24 DIAGNOSIS — O3433 Maternal care for cervical incompetence, third trimester: Secondary | ICD-10-CM | POA: Diagnosis not present

## 2017-01-24 DIAGNOSIS — Z3A28 28 weeks gestation of pregnancy: Secondary | ICD-10-CM | POA: Insufficient documentation

## 2017-01-24 DIAGNOSIS — O26879 Cervical shortening, unspecified trimester: Secondary | ICD-10-CM | POA: Diagnosis present

## 2017-01-24 DIAGNOSIS — O09212 Supervision of pregnancy with history of pre-term labor, second trimester: Secondary | ICD-10-CM | POA: Diagnosis not present

## 2017-01-24 DIAGNOSIS — O09523 Supervision of elderly multigravida, third trimester: Secondary | ICD-10-CM | POA: Insufficient documentation

## 2017-01-24 DIAGNOSIS — O09529 Supervision of elderly multigravida, unspecified trimester: Secondary | ICD-10-CM

## 2017-01-24 DIAGNOSIS — O09522 Supervision of elderly multigravida, second trimester: Secondary | ICD-10-CM

## 2017-01-24 DIAGNOSIS — O09899 Supervision of other high risk pregnancies, unspecified trimester: Secondary | ICD-10-CM

## 2017-01-24 DIAGNOSIS — O26873 Cervical shortening, third trimester: Secondary | ICD-10-CM | POA: Diagnosis not present

## 2017-01-24 DIAGNOSIS — Z8751 Personal history of pre-term labor: Secondary | ICD-10-CM

## 2017-01-24 DIAGNOSIS — O3442 Maternal care for other abnormalities of cervix, second trimester: Secondary | ICD-10-CM

## 2017-01-24 DIAGNOSIS — N883 Incompetence of cervix uteri: Secondary | ICD-10-CM

## 2017-01-24 DIAGNOSIS — D563 Thalassemia minor: Secondary | ICD-10-CM

## 2017-01-24 NOTE — Patient Instructions (Signed)
Third Trimester of Pregnancy The third trimester is from week 28 through week 40 (months 7 through 9). The third trimester is a time when the unborn baby (fetus) is growing rapidly. At the end of the ninth month, the fetus is about 20 inches in length and weighs 6-10 pounds. Body changes during your third trimester Your body will continue to go through many changes during pregnancy. The changes vary from woman to woman. During the third trimester:  Your weight will continue to increase. You can expect to gain 25-35 pounds (11-16 kg) by the end of the pregnancy.  You may begin to get stretch marks on your hips, abdomen, and breasts.  You may urinate more often because the fetus is moving lower into your pelvis and pressing on your bladder.  You may develop or continue to have heartburn. This is caused by increased hormones that slow down muscles in the digestive tract.  You may develop or continue to have constipation because increased hormones slow digestion and cause the muscles that push waste through your intestines to relax.  You may develop hemorrhoids. These are swollen veins (varicose veins) in the rectum that can itch or be painful.  You may develop swollen, bulging veins (varicose veins) in your legs.  You may have increased body aches in the pelvis, back, or thighs. This is due to weight gain and increased hormones that are relaxing your joints.  You may have changes in your hair. These can include thickening of your hair, rapid growth, and changes in texture. Some women also have hair loss during or after pregnancy, or hair that feels dry or thin. Your hair will most likely return to normal after your baby is born.  Your breasts will continue to grow and they will continue to become tender. A yellow fluid (colostrum) may leak from your breasts. This is the first milk you are producing for your baby.  Your belly button may stick out.  You may notice more swelling in your hands,  face, or ankles.  You may have increased tingling or numbness in your hands, arms, and legs. The skin on your belly may also feel numb.  You may feel short of breath because of your expanding uterus.  You may have more problems sleeping. This can be caused by the size of your belly, increased need to urinate, and an increase in your body's metabolism.  You may notice the fetus "dropping," or moving lower in your abdomen (lightening).  You may have increased vaginal discharge.  You may notice your joints feel loose and you may have pain around your pelvic bone.  What to expect at prenatal visits You will have prenatal exams every 2 weeks until week 36. Then you will have weekly prenatal exams. During a routine prenatal visit:  You will be weighed to make sure you and the baby are growing normally.  Your blood pressure will be taken.  Your abdomen will be measured to track your baby's growth.  The fetal heartbeat will be listened to.  Any test results from the previous visit will be discussed.  You may have a cervical check near your due date to see if your cervix has softened or thinned (effaced).  You will be tested for Group B streptococcus. This happens between 35 and 37 weeks.  Your health care provider may ask you:  What your birth plan is.  How you are feeling.  If you are feeling the baby move.  If you have had   any abnormal symptoms, such as leaking fluid, bleeding, severe headaches, or abdominal cramping.  If you are using any tobacco products, including cigarettes, chewing tobacco, and electronic cigarettes.  If you have any questions.  Other tests or screenings that may be performed during your third trimester include:  Blood tests that check for low iron levels (anemia).  Fetal testing to check the health, activity level, and growth of the fetus. Testing is done if you have certain medical conditions or if there are problems during the  pregnancy.  Nonstress test (NST). This test checks the health of your baby to make sure there are no signs of problems, such as the baby not getting enough oxygen. During this test, a belt is placed around your belly. The baby is made to move, and its heart rate is monitored during movement.  What is false labor? False labor is a condition in which you feel small, irregular tightenings of the muscles in the womb (contractions) that usually go away with rest, changing position, or drinking water. These are called Braxton Hicks contractions. Contractions may last for hours, days, or even weeks before true labor sets in. If contractions come at regular intervals, become more frequent, increase in intensity, or become painful, you should see your health care provider. What are the signs of labor?  Abdominal cramps.  Regular contractions that start at 10 minutes apart and become stronger and more frequent with time.  Contractions that start on the top of the uterus and spread down to the lower abdomen and back.  Increased pelvic pressure and dull back pain.  A watery or bloody mucus discharge that comes from the vagina.  Leaking of amniotic fluid. This is also known as your "water breaking." It could be a slow trickle or a gush. Let your health care provider know if it has a color or strange odor. If you have any of these signs, call your health care provider right away, even if it is before your due date. Follow these instructions at home: Medicines  Follow your health care provider's instructions regarding medicine use. Specific medicines may be either safe or unsafe to take during pregnancy.  Take a prenatal vitamin that contains at least 600 micrograms (mcg) of folic acid.  If you develop constipation, try taking a stool softener if your health care provider approves. Eating and drinking  Eat a balanced diet that includes fresh fruits and vegetables, whole grains, good sources of protein  such as meat, eggs, or tofu, and low-fat dairy. Your health care provider will help you determine the amount of weight gain that is right for you.  Avoid raw meat and uncooked cheese. These carry germs that can cause birth defects in the baby.  If you have low calcium intake from food, talk to your health care provider about whether you should take a daily calcium supplement.  Eat four or five small meals rather than three large meals a day.  Limit foods that are high in fat and processed sugars, such as fried and sweet foods.  To prevent constipation: ? Drink enough fluid to keep your urine clear or pale yellow. ? Eat foods that are high in fiber, such as fresh fruits and vegetables, whole grains, and beans. Activity  Exercise only as directed by your health care provider. Most women can continue their usual exercise routine during pregnancy. Try to exercise for 30 minutes at least 5 days a week. Stop exercising if you experience uterine contractions.  Avoid heavy   lifting.  Do not exercise in extreme heat or humidity, or at high altitudes.  Wear low-heel, comfortable shoes.  Practice good posture.  You may continue to have sex unless your health care provider tells you otherwise. Relieving pain and discomfort  Take frequent breaks and rest with your legs elevated if you have leg cramps or low back pain.  Take warm sitz baths to soothe any pain or discomfort caused by hemorrhoids. Use hemorrhoid cream if your health care provider approves.  Wear a good support bra to prevent discomfort from breast tenderness.  If you develop varicose veins: ? Wear support pantyhose or compression stockings as told by your healthcare provider. ? Elevate your feet for 15 minutes, 3-4 times a day. Prenatal care  Write down your questions. Take them to your prenatal visits.  Keep all your prenatal visits as told by your health care provider. This is important. Safety  Wear your seat belt at  all times when driving.  Make a list of emergency phone numbers, including numbers for family, friends, the hospital, and police and fire departments. General instructions  Avoid cat litter boxes and soil used by cats. These carry germs that can cause birth defects in the baby. If you have a cat, ask someone to clean the litter box for you.  Do not travel far distances unless it is absolutely necessary and only with the approval of your health care provider.  Do not use hot tubs, steam rooms, or saunas.  Do not drink alcohol.  Do not use any products that contain nicotine or tobacco, such as cigarettes and e-cigarettes. If you need help quitting, ask your health care provider.  Do not use any medicinal herbs or unprescribed drugs. These chemicals affect the formation and growth of the baby.  Do not douche or use tampons or scented sanitary pads.  Do not cross your legs for long periods of time.  To prepare for the arrival of your baby: ? Take prenatal classes to understand, practice, and ask questions about labor and delivery. ? Make a trial run to the hospital. ? Visit the hospital and tour the maternity area. ? Arrange for maternity or paternity leave through employers. ? Arrange for family and friends to take care of pets while you are in the hospital. ? Purchase a rear-facing car seat and make sure you know how to install it in your car. ? Pack your hospital bag. ? Prepare the baby's nursery. Make sure to remove all pillows and stuffed animals from the baby's crib to prevent suffocation.  Visit your dentist if you have not gone during your pregnancy. Use a soft toothbrush to brush your teeth and be gentle when you floss. Contact a health care provider if:  You are unsure if you are in labor or if your water has broken.  You become dizzy.  You have mild pelvic cramps, pelvic pressure, or nagging pain in your abdominal area.  You have lower back pain.  You have persistent  nausea, vomiting, or diarrhea.  You have an unusual or bad smelling vaginal discharge.  You have pain when you urinate. Get help right away if:  Your water breaks before 37 weeks.  You have regular contractions less than 5 minutes apart before 37 weeks.  You have a fever.  You are leaking fluid from your vagina.  You have spotting or bleeding from your vagina.  You have severe abdominal pain or cramping.  You have rapid weight loss or weight gain.    You have shortness of breath with chest pain.  You notice sudden or extreme swelling of your face, hands, ankles, feet, or legs.  Your baby makes fewer than 10 movements in 2 hours.  You have severe headaches that do not go away when you take medicine.  You have vision changes. Summary  The third trimester is from week 28 through week 40, months 7 through 9. The third trimester is a time when the unborn baby (fetus) is growing rapidly.  During the third trimester, your discomfort may increase as you and your baby continue to gain weight. You may have abdominal, leg, and back pain, sleeping problems, and an increased need to urinate.  During the third trimester your breasts will keep growing and they will continue to become tender. A yellow fluid (colostrum) may leak from your breasts. This is the first milk you are producing for your baby.  False labor is a condition in which you feel small, irregular tightenings of the muscles in the womb (contractions) that eventually go away. These are called Braxton Hicks contractions. Contractions may last for hours, days, or even weeks before true labor sets in.  Signs of labor can include: abdominal cramps; regular contractions that start at 10 minutes apart and become stronger and more frequent with time; watery or bloody mucus discharge that comes from the vagina; increased pelvic pressure and dull back pain; and leaking of amniotic fluid. This information is not intended to replace advice  given to you by your health care provider. Make sure you discuss any questions you have with your health care provider. Document Released: 08/17/2001 Document Revised: 01/29/2016 Document Reviewed: 10/24/2012 Elsevier Interactive Patient Education  2017 [employer]  

## 2017-01-24 NOTE — Progress Notes (Signed)
Subjective:  Heather Little is a [age] y.o. [id number] at 4120w2d being seen today for ongoing prenatal care.  She is currently monitored for the following issues for this high-risk pregnancy and has Beta thalassemia trait; History of premature delivery; History of premature rupture of membranes; Incompetency, cervical; Encounter for supervision of high-risk pregnancy with elderly multigravida; and Short interval between pregnancies affecting pregnancy, antepartum on her problem list.  Patient reports no complaints.  Contractions: Not present. Vag. Bleeding: None.  Movement: Present. Denies leaking of fluid.   The following portions of the patient's history were reviewed and updated as appropriate: allergies, current medications, past family history, past medical history, past social history, past surgical history and problem list. Problem list updated.  Objective:   Vitals:   01/24/17 0900  BP: 107/67  Pulse: 89  Weight: 185 lb 4.8 oz (84.1 kg)    Fetal Status: Fetal Heart Rate (bpm): 145   Movement: Present     General:  Alert, oriented and cooperative. Patient is in no acute distress.  Skin: Skin is warm and dry. No rash noted.   Cardiovascular: Normal heart rate noted  Respiratory: Normal respiratory effort, no problems with respiration noted  Abdomen: Soft, gravid, appropriate for gestational age. Pain/Pressure: Absent     Pelvic:  Cervical exam deferred        Extremities: Normal range of motion.  Edema: None  Mental Status: Normal mood and affect. Normal behavior. Normal judgment and thought content.   Urinalysis:      Assessment and Plan:  Pregnancy: [id number] at 7520w2d  1. Incompetency, cervical CL stable Declined cerclage  Continue with 17 OHP  2. Beta thalassemia trait Declined amnio, NIPS  3. History of premature delivery Continue with 17-OHP  4. Encounter for supervision of high-risk pregnancy with elderly multigravida  - Glucose Tolerance, 2 Hours w/1 Hour - CBC - HIV  antibody (with reflex) - RPR  Preterm labor symptoms and general obstetric precautions including but not limited to vaginal bleeding, contractions, leaking of fluid and fetal movement were reviewed in detail with the patient. Please refer to After Visit Summary for other counseling recommendations.  No Follow-up on file.   Hermina StaggersErvin, Deserie Dirks L, MD

## 2017-01-24 NOTE — Progress Notes (Signed)
Patient reports good fetal movement, denies pain/contractions. 

## 2017-01-25 ENCOUNTER — Other Ambulatory Visit (HOSPITAL_COMMUNITY): Payer: Self-pay | Admitting: *Deleted

## 2017-01-25 DIAGNOSIS — O09523 Supervision of elderly multigravida, third trimester: Secondary | ICD-10-CM

## 2017-01-25 LAB — CBC
HEMOGLOBIN: 10.1 g/dL — AB (ref 11.1–15.9)
Hematocrit: 32.4 % — ABNORMAL LOW (ref 34.0–46.6)
MCH: 22.9 pg — AB (ref 26.6–33.0)
MCHC: 31.2 g/dL — ABNORMAL LOW (ref 31.5–35.7)
MCV: 73 fL — AB (ref 79–97)
Platelets: 207 10*3/uL (ref 150–379)
RBC: 4.42 x10E6/uL (ref 3.77–5.28)
RDW: 17.4 % — ABNORMAL HIGH (ref 12.3–15.4)
WBC: 7.7 10*3/uL (ref 3.4–10.8)

## 2017-01-25 LAB — GLUCOSE TOLERANCE, 2 HOURS W/ 1HR
GLUCOSE, 1 HOUR: 166 mg/dL (ref 65–179)
Glucose, 2 hour: 139 mg/dL (ref 65–152)
Glucose, Fasting: 98 mg/dL — ABNORMAL HIGH (ref 65–91)

## 2017-01-25 LAB — HIV ANTIBODY (ROUTINE TESTING W REFLEX): HIV SCREEN 4TH GENERATION: NONREACTIVE

## 2017-01-25 LAB — RPR: RPR Ser Ql: NONREACTIVE

## 2017-01-26 ENCOUNTER — Other Ambulatory Visit: Payer: Self-pay

## 2017-01-26 DIAGNOSIS — O24410 Gestational diabetes mellitus in pregnancy, diet controlled: Secondary | ICD-10-CM

## 2017-01-26 MED ORDER — MISC. DEVICES MISC: 99 refills | Status: DC

## 2017-02-01 ENCOUNTER — Ambulatory Visit (INDEPENDENT_AMBULATORY_CARE_PROVIDER_SITE_OTHER): Payer: Medicaid Other

## 2017-02-01 VITALS — BP 113/71 | HR 93 | Wt 184.0 lb

## 2017-02-01 DIAGNOSIS — Z8751 Personal history of pre-term labor: Secondary | ICD-10-CM

## 2017-02-01 DIAGNOSIS — O09213 Supervision of pregnancy with history of pre-term labor, third trimester: Secondary | ICD-10-CM | POA: Diagnosis not present

## 2017-02-01 NOTE — Progress Notes (Signed)
Patient presents for 17P injection. Given in LUOQ. Tolerated well. Administrations This Visit    hydroxyprogesterone caproate (MAKENA) 250 mg/mL injection 250 mg    Admin Date 02/01/2017 Action Given Dose 250 mg Route Intramuscular Administered By Maretta BeesMcGlashan, Carol J, RMA

## 2017-02-07 ENCOUNTER — Ambulatory Visit (INDEPENDENT_AMBULATORY_CARE_PROVIDER_SITE_OTHER): Payer: Medicaid Other | Admitting: Obstetrics & Gynecology

## 2017-02-07 VITALS — BP 109/67 | HR 89 | Wt 183.0 lb

## 2017-02-07 DIAGNOSIS — Z8751 Personal history of pre-term labor: Secondary | ICD-10-CM

## 2017-02-07 DIAGNOSIS — O09213 Supervision of pregnancy with history of pre-term labor, third trimester: Secondary | ICD-10-CM | POA: Diagnosis not present

## 2017-02-07 DIAGNOSIS — O24419 Gestational diabetes mellitus in pregnancy, unspecified control: Secondary | ICD-10-CM

## 2017-02-07 DIAGNOSIS — O09523 Supervision of elderly multigravida, third trimester: Secondary | ICD-10-CM

## 2017-02-07 DIAGNOSIS — O09529 Supervision of elderly multigravida, unspecified trimester: Secondary | ICD-10-CM

## 2017-02-07 NOTE — Progress Notes (Signed)
Patient presents for ROB and 17P injection.  Makena given in LUOQ Administrations This Visit    hydroxyprogesterone caproate (MAKENA) 250 mg/mL injection 250 mg    Admin Date 02/07/2017 Action Given Dose 250 mg Route Intramuscular Administered By Hamilton CapriBurch, Ariel J, CMA          Patient has a rash on stomach located at 4pm.

## 2017-02-07 NOTE — Progress Notes (Signed)
   PRENATAL VISIT NOTE  Subjective:  Heather Little is a 36 y.o. [id number] at 2727w2d being seen today for ongoing prenatal care.  She is currently monitored for the following issues for this high-risk pregnancy and has Beta thalassemia trait; History of premature delivery; History of premature rupture of membranes; Incompetency, cervical; Encounter for supervision of high-risk pregnancy with elderly multigravida; Short interval between pregnancies affecting pregnancy, antepartum; and Gestational diabetes mellitus (GDM), antepartum on her problem list.  Patient reports no complaints.  Contractions: Not present. Vag. Bleeding: None.  Movement: Present. Denies leaking of fluid.   The following portions of the patient's history were reviewed and updated as appropriate: allergies, current medications, past family history, past medical history, past social history, past surgical history and problem list. Problem list updated.  Objective:   Vitals:   02/07/17 1311  BP: 109/67  Pulse: 89  Weight: 83 kg (183 lb)    Fetal Status: Fetal Heart Rate (bpm): 156   Movement: Present     General:  Alert, oriented and cooperative. Patient is in no acute distress.  Skin: Skin is warm and dry. No rash noted.   Cardiovascular: Normal heart rate noted  Respiratory: Normal respiratory effort, no problems with respiration noted  Abdomen: Soft, gravid, appropriate for gestational age. Pain/Pressure: Present     Pelvic:  Cervical exam deferred        Extremities: Normal range of motion.  Edema: None  Mental Status: Normal mood and affect. Normal behavior. Normal judgment and thought content.   Assessment and Plan:  Pregnancy: [id number] at 827w2d  1. Encounter for supervision of high-risk pregnancy with elderly multigravida   2. History of premature delivery 17 P today  3. Gestational diabetes mellitus (GDM), antepartum, gestational diabetes method of control unspecified Needs appointment with DM  educator  Preterm labor symptoms and general obstetric precautions including but not limited to vaginal bleeding, contractions, leaking of fluid and fetal movement were reviewed in detail with the patient. Please refer to After Visit Summary for other counseling recommendations.  Return in about 2 weeks (around 02/21/2017).   Scheryl DarterJames Storm Dulski, MD

## 2017-02-07 NOTE — Patient Instructions (Signed)
Third Trimester of Pregnancy The third trimester is from week 28 through week 40 (months 7 through 9). The third trimester is a time when the unborn baby (fetus) is growing rapidly. At the end of the ninth month, the fetus is about 20 inches in length and weighs 6-10 pounds. Body changes during your third trimester Your body will continue to go through many changes during pregnancy. The changes vary from woman to woman. During the third trimester:  Your weight will continue to increase. You can expect to gain 25-35 pounds (11-16 kg) by the end of the pregnancy.  You may begin to get stretch marks on your hips, abdomen, and breasts.  You may urinate more often because the fetus is moving lower into your pelvis and pressing on your bladder.  You may develop or continue to have heartburn. This is caused by increased hormones that slow down muscles in the digestive tract.  You may develop or continue to have constipation because increased hormones slow digestion and cause the muscles that push waste through your intestines to relax.  You may develop hemorrhoids. These are swollen veins (varicose veins) in the rectum that can itch or be painful.  You may develop swollen, bulging veins (varicose veins) in your legs.  You may have increased body aches in the pelvis, back, or thighs. This is due to weight gain and increased hormones that are relaxing your joints.  You may have changes in your hair. These can include thickening of your hair, rapid growth, and changes in texture. Some women also have hair loss during or after pregnancy, or hair that feels dry or thin. Your hair will most likely return to normal after your baby is born.  Your breasts will continue to grow and they will continue to become tender. A yellow fluid (colostrum) may leak from your breasts. This is the first milk you are producing for your baby.  Your belly button may stick out.  You may notice more swelling in your hands,  face, or ankles.  You may have increased tingling or numbness in your hands, arms, and legs. The skin on your belly may also feel numb.  You may feel short of breath because of your expanding uterus.  You may have more problems sleeping. This can be caused by the size of your belly, increased need to urinate, and an increase in your body's metabolism.  You may notice the fetus "dropping," or moving lower in your abdomen (lightening).  You may have increased vaginal discharge.  You may notice your joints feel loose and you may have pain around your pelvic bone.  What to expect at prenatal visits You will have prenatal exams every 2 weeks until week 36. Then you will have weekly prenatal exams. During a routine prenatal visit:  You will be weighed to make sure you and the baby are growing normally.  Your blood pressure will be taken.  Your abdomen will be measured to track your baby's growth.  The fetal heartbeat will be listened to.  Any test results from the previous visit will be discussed.  You may have a cervical check near your due date to see if your cervix has softened or thinned (effaced).  You will be tested for Group B streptococcus. This happens between 35 and 37 weeks.  Your health care provider may ask you:  What your birth plan is.  How you are feeling.  If you are feeling the baby move.  If you have had   any abnormal symptoms, such as leaking fluid, bleeding, severe headaches, or abdominal cramping.  If you are using any tobacco products, including cigarettes, chewing tobacco, and electronic cigarettes.  If you have any questions.  Other tests or screenings that may be performed during your third trimester include:  Blood tests that check for low iron levels (anemia).  Fetal testing to check the health, activity level, and growth of the fetus. Testing is done if you have certain medical conditions or if there are problems during the  pregnancy.  Nonstress test (NST). This test checks the health of your baby to make sure there are no signs of problems, such as the baby not getting enough oxygen. During this test, a belt is placed around your belly. The baby is made to move, and its heart rate is monitored during movement.  What is false labor? False labor is a condition in which you feel small, irregular tightenings of the muscles in the womb (contractions) that usually go away with rest, changing position, or drinking water. These are called Braxton Hicks contractions. Contractions may last for hours, days, or even weeks before true labor sets in. If contractions come at regular intervals, become more frequent, increase in intensity, or become painful, you should see your health care provider. What are the signs of labor?  Abdominal cramps.  Regular contractions that start at 10 minutes apart and become stronger and more frequent with time.  Contractions that start on the top of the uterus and spread down to the lower abdomen and back.  Increased pelvic pressure and dull back pain.  A watery or bloody mucus discharge that comes from the vagina.  Leaking of amniotic fluid. This is also known as your "water breaking." It could be a slow trickle or a gush. Let your health care provider know if it has a color or strange odor. If you have any of these signs, call your health care provider right away, even if it is before your due date. Follow these instructions at home: Medicines  Follow your health care provider's instructions regarding medicine use. Specific medicines may be either safe or unsafe to take during pregnancy.  Take a prenatal vitamin that contains at least 600 micrograms (mcg) of folic acid.  If you develop constipation, try taking a stool softener if your health care provider approves. Eating and drinking  Eat a balanced diet that includes fresh fruits and vegetables, whole grains, good sources of protein  such as meat, eggs, or tofu, and low-fat dairy. Your health care provider will help you determine the amount of weight gain that is right for you.  Avoid raw meat and uncooked cheese. These carry germs that can cause birth defects in the baby.  If you have low calcium intake from food, talk to your health care provider about whether you should take a daily calcium supplement.  Eat four or five small meals rather than three large meals a day.  Limit foods that are high in fat and processed sugars, such as fried and sweet foods.  To prevent constipation: ? Drink enough fluid to keep your urine clear or pale yellow. ? Eat foods that are high in fiber, such as fresh fruits and vegetables, whole grains, and beans. Activity  Exercise only as directed by your health care provider. Most women can continue their usual exercise routine during pregnancy. Try to exercise for 30 minutes at least 5 days a week. Stop exercising if you experience uterine contractions.  Avoid heavy   lifting.  Do not exercise in extreme heat or humidity, or at high altitudes.  Wear low-heel, comfortable shoes.  Practice good posture.  You may continue to have sex unless your health care provider tells you otherwise. Relieving pain and discomfort  Take frequent breaks and rest with your legs elevated if you have leg cramps or low back pain.  Take warm sitz baths to soothe any pain or discomfort caused by hemorrhoids. Use hemorrhoid cream if your health care provider approves.  Wear a good support bra to prevent discomfort from breast tenderness.  If you develop varicose veins: ? Wear support pantyhose or compression stockings as told by your healthcare provider. ? Elevate your feet for 15 minutes, 3-4 times a day. Prenatal care  Write down your questions. Take them to your prenatal visits.  Keep all your prenatal visits as told by your health care provider. This is important. Safety  Wear your seat belt at  all times when driving.  Make a list of emergency phone numbers, including numbers for family, friends, the hospital, and police and fire departments. General instructions  Avoid cat litter boxes and soil used by cats. These carry germs that can cause birth defects in the baby. If you have a cat, ask someone to clean the litter box for you.  Do not travel far distances unless it is absolutely necessary and only with the approval of your health care provider.  Do not use hot tubs, steam rooms, or saunas.  Do not drink alcohol.  Do not use any products that contain nicotine or tobacco, such as cigarettes and e-cigarettes. If you need help quitting, ask your health care provider.  Do not use any medicinal herbs or unprescribed drugs. These chemicals affect the formation and growth of the baby.  Do not douche or use tampons or scented sanitary pads.  Do not cross your legs for long periods of time.  To prepare for the arrival of your baby: ? Take prenatal classes to understand, practice, and ask questions about labor and delivery. ? Make a trial run to the hospital. ? Visit the hospital and tour the maternity area. ? Arrange for maternity or paternity leave through employers. ? Arrange for family and friends to take care of pets while you are in the hospital. ? Purchase a rear-facing car seat and make sure you know how to install it in your car. ? Pack your hospital bag. ? Prepare the baby's nursery. Make sure to remove all pillows and stuffed animals from the baby's crib to prevent suffocation.  Visit your dentist if you have not gone during your pregnancy. Use a soft toothbrush to brush your teeth and be gentle when you floss. Contact a health care provider if:  You are unsure if you are in labor or if your water has broken.  You become dizzy.  You have mild pelvic cramps, pelvic pressure, or nagging pain in your abdominal area.  You have lower back pain.  You have persistent  nausea, vomiting, or diarrhea.  You have an unusual or bad smelling vaginal discharge.  You have pain when you urinate. Get help right away if:  Your water breaks before 37 weeks.  You have regular contractions less than 5 minutes apart before 37 weeks.  You have a fever.  You are leaking fluid from your vagina.  You have spotting or bleeding from your vagina.  You have severe abdominal pain or cramping.  You have rapid weight loss or weight gain.    You have shortness of breath with chest pain.  You notice sudden or extreme swelling of your face, hands, ankles, feet, or legs.  Your baby makes fewer than 10 movements in 2 hours.  You have severe headaches that do not go away when you take medicine.  You have vision changes. Summary  The third trimester is from week 28 through week 40, months 7 through 9. The third trimester is a time when the unborn baby (fetus) is growing rapidly.  During the third trimester, your discomfort may increase as you and your baby continue to gain weight. You may have abdominal, leg, and back pain, sleeping problems, and an increased need to urinate.  During the third trimester your breasts will keep growing and they will continue to become tender. A yellow fluid (colostrum) may leak from your breasts. This is the first milk you are producing for your baby.  False labor is a condition in which you feel small, irregular tightenings of the muscles in the womb (contractions) that eventually go away. These are called Braxton Hicks contractions. Contractions may last for hours, days, or even weeks before true labor sets in.  Signs of labor can include: abdominal cramps; regular contractions that start at 10 minutes apart and become stronger and more frequent with time; watery or bloody mucus discharge that comes from the vagina; increased pelvic pressure and dull back pain; and leaking of amniotic fluid. This information is not intended to replace advice  given to you by your health care provider. Make sure you discuss any questions you have with your health care provider. Document Released: 08/17/2001 Document Revised: 01/29/2016 Document Reviewed: 10/24/2012 Elsevier Interactive Patient Education  2017 [employer]  

## 2017-02-14 ENCOUNTER — Ambulatory Visit (INDEPENDENT_AMBULATORY_CARE_PROVIDER_SITE_OTHER): Payer: Medicaid Other | Admitting: *Deleted

## 2017-02-14 ENCOUNTER — Telehealth: Payer: Self-pay

## 2017-02-14 VITALS — BP 101/68 | HR 87

## 2017-02-14 DIAGNOSIS — O09213 Supervision of pregnancy with history of pre-term labor, third trimester: Secondary | ICD-10-CM | POA: Diagnosis not present

## 2017-02-14 DIAGNOSIS — O24419 Gestational diabetes mellitus in pregnancy, unspecified control: Secondary | ICD-10-CM

## 2017-02-14 DIAGNOSIS — O09529 Supervision of elderly multigravida, unspecified trimester: Secondary | ICD-10-CM

## 2017-02-14 MED ORDER — GLUCOSE BLOOD VI STRP: ORAL_STRIP | 12 refills | Status: DC

## 2017-02-14 MED ORDER — ACCU-CHEK MULTICLIX LANCETS MISC: 5 refills | Status: DC

## 2017-02-14 MED ORDER — ACCU-CHEK AVIVA PLUS W/DEVICE KIT: 1.0000 | PACK | Freq: Four times a day (QID) | 0 refills | Status: DC

## 2017-02-14 MED ORDER — ACCU-CHEK GUIDE W/DEVICE KIT: 1.0000 | PACK | Freq: Once | 0 refills | Status: DC

## 2017-02-14 MED ORDER — ACCU-CHEK FASTCLIX LANCETS MISC: 1.0000 | Freq: Four times a day (QID) | 5 refills | Status: DC

## 2017-02-14 MED ORDER — GLUCOSE BLOOD VI STRP: ORAL_STRIP | 5 refills | Status: DC

## 2017-02-14 NOTE — Progress Notes (Signed)
Pt is in office for 17p injection today. Pt tolerated injection well. Pt has no other concerns today.   Administrations This Visit    hydroxyprogesterone caproate (MAKENA) 250 mg/mL injection 250 mg    Admin Date 02/14/2017 Action Given Dose 250 mg Route Intramuscular Administered By Lanney GinsFoster, Catie Chiao D, CMA

## 2017-02-14 NOTE — Telephone Encounter (Signed)
...  changed glucose supplies to Accu check aviva plus and sent to pharmacy

## 2017-02-16 ENCOUNTER — Encounter: Payer: Medicaid Other | Attending: Obstetrics & Gynecology | Admitting: Registered"

## 2017-02-16 DIAGNOSIS — O24410 Gestational diabetes mellitus in pregnancy, diet controlled: Secondary | ICD-10-CM | POA: Insufficient documentation

## 2017-02-16 DIAGNOSIS — R7309 Other abnormal glucose: Secondary | ICD-10-CM

## 2017-02-16 DIAGNOSIS — Z713 Dietary counseling and surveillance: Secondary | ICD-10-CM | POA: Insufficient documentation

## 2017-02-16 DIAGNOSIS — Z3A00 Weeks of gestation of pregnancy not specified: Secondary | ICD-10-CM | POA: Insufficient documentation

## 2017-02-17 ENCOUNTER — Encounter: Payer: Self-pay | Admitting: Registered"

## 2017-02-17 NOTE — Progress Notes (Signed)
Patient was seen on 02/16/2017 for Gestational Diabetes self-management class at the [facility]. The following learning objectives were met by the patient during this course:   States the definition of Gestational Diabetes  States why dietary management is important in controlling blood glucose  Describes the effects each nutrient has on blood glucose levels  Demonstrates ability to create a balanced meal plan  Demonstrates carbohydrate counting   States when to check blood glucose levels  Demonstrates proper blood glucose monitoring techniques  States the effect of stress and exercise on blood glucose levels  States the importance of limiting caffeine and abstaining from alcohol and smoking  Blood glucose monitor given: none given Lot # n/a Exp: n/a Blood glucose reading: n/a  Patient instructed to monitor glucose levels: FBS: 60 - <90 1 hour: <140 2 hour: <120  Patient received handouts:  Nutrition Diabetes and Pregnancy  Carbohydrate Counting List  Patient will be seen for follow-up as needed. 

## 2017-02-21 ENCOUNTER — Ambulatory Visit (HOSPITAL_COMMUNITY)
Admission: RE | Admit: 2017-02-21 | Discharge: 2017-02-21 | Disposition: A | Discharge status: Home / Self Care | Payer: Medicaid Other | Source: Ambulatory Visit | Attending: Obstetrics | Admitting: Obstetrics

## 2017-02-21 ENCOUNTER — Other Ambulatory Visit (HOSPITAL_COMMUNITY): Payer: Self-pay | Admitting: Obstetrics and Gynecology

## 2017-02-21 ENCOUNTER — Encounter (HOSPITAL_COMMUNITY): Payer: Self-pay

## 2017-02-21 DIAGNOSIS — O09219 Supervision of pregnancy with history of pre-term labor, unspecified trimester: Secondary | ICD-10-CM

## 2017-02-21 DIAGNOSIS — Z3A32 32 weeks gestation of pregnancy: Secondary | ICD-10-CM | POA: Diagnosis not present

## 2017-02-21 DIAGNOSIS — O09213 Supervision of pregnancy with history of pre-term labor, third trimester: Secondary | ICD-10-CM | POA: Insufficient documentation

## 2017-02-21 DIAGNOSIS — O09523 Supervision of elderly multigravida, third trimester: Secondary | ICD-10-CM | POA: Insufficient documentation

## 2017-02-21 DIAGNOSIS — O3433 Maternal care for cervical incompetence, third trimester: Secondary | ICD-10-CM | POA: Insufficient documentation

## 2017-02-21 DIAGNOSIS — O09899 Supervision of other high risk pregnancies, unspecified trimester: Secondary | ICD-10-CM

## 2017-02-22 ENCOUNTER — Ambulatory Visit (INDEPENDENT_AMBULATORY_CARE_PROVIDER_SITE_OTHER): Payer: Medicaid Other | Admitting: Obstetrics & Gynecology

## 2017-02-22 ENCOUNTER — Other Ambulatory Visit (HOSPITAL_COMMUNITY): Payer: Self-pay | Admitting: *Deleted

## 2017-02-22 DIAGNOSIS — O09523 Supervision of elderly multigravida, third trimester: Secondary | ICD-10-CM | POA: Diagnosis not present

## 2017-02-22 DIAGNOSIS — O09529 Supervision of elderly multigravida, unspecified trimester: Secondary | ICD-10-CM

## 2017-02-22 DIAGNOSIS — O09213 Supervision of pregnancy with history of pre-term labor, third trimester: Secondary | ICD-10-CM | POA: Diagnosis not present

## 2017-02-22 NOTE — Patient Instructions (Signed)

## 2017-02-22 NOTE — Progress Notes (Signed)
17P given in LUOQ. Tolerated well.  Administrations This Visit    hydroxyprogesterone caproate (MAKENA) 250 mg/mL injection 250 mg    Admin Date 02/22/2017 Action Given Dose 250 mg Route Intramuscular Administered By Maretta BeesMcGlashan, Beckham Buxbaum J, RMA

## 2017-02-22 NOTE — Progress Notes (Signed)
   PRENATAL VISIT NOTE  Subjective:  Heather Little is a 36 y.o. [id number] at [redacted]w[redacted]d being seen today for ongoing prenatal care.  She is currently monitored for the following issues for this high-risk pregnancy and has Beta thalassemia trait; History of premature delivery; History of premature rupture of membranes; Incompetency, cervical; Encounter for supervision of high-risk pregnancy with elderly multigravida; Short interval between pregnancies affecting pregnancy, antepartum; and Gestational diabetes mellitus (GDM), antepartum on her problem list.  Patient reports no complaints.  Contractions: Not present. Vag. Bleeding: None.  Movement: Present. Denies leaking of fluid.   The following portions of the patient's history were reviewed and updated as appropriate: allergies, current medications, past family history, past medical history, past social history, past surgical history and problem list. Problem list updated.  Objective:   Vitals:   02/22/17 1332  BP: 112/70  Pulse: 92  Weight: 186 lb 14.4 oz (84.8 kg)    Fetal Status: Fetal Heart Rate (bpm): 138 Fundal Height: 33 cm Movement: Present     General:  Alert, oriented and cooperative. Patient is in no acute distress.  Skin: Skin is warm and dry. No rash noted.   Cardiovascular: Normal heart rate noted  Respiratory: Normal respiratory effort, no problems with respiration noted  Abdomen: Soft, gravid, appropriate for gestational age. Pain/Pressure: Absent     Pelvic:  Cervical exam deferred        Extremities: Normal range of motion.  Edema: None  Mental Status: Normal mood and affect. Normal behavior. Normal judgment and thought content.   Assessment and Plan:  Pregnancy: [id number] at [redacted]w[redacted]d  1. Encounter for supervision of high-risk pregnancy with elderly multigravida   Preterm labor symptoms and general obstetric precautions including but not limited to vaginal bleeding, contractions, leaking of fluid and fetal movement were  reviewed in detail with the patient. Please refer to After Visit Summary for other counseling recommendations.  Return in about 2 weeks (around 03/08/2017).   Scheryl DarterJames Jamyah Folk, MD

## 2017-02-28 ENCOUNTER — Encounter: Payer: Self-pay | Admitting: *Deleted

## 2017-03-01 ENCOUNTER — Ambulatory Visit (INDEPENDENT_AMBULATORY_CARE_PROVIDER_SITE_OTHER): Payer: Medicaid Other

## 2017-03-01 VITALS — Wt 186.0 lb

## 2017-03-01 DIAGNOSIS — O09213 Supervision of pregnancy with history of pre-term labor, third trimester: Secondary | ICD-10-CM

## 2017-03-01 DIAGNOSIS — Z8751 Personal history of pre-term labor: Secondary | ICD-10-CM

## 2017-03-01 NOTE — Progress Notes (Signed)
Patient presents for 17P Injection. Given in RUOQ. Tolerated well.  Administrations This Visit    hydroxyprogesterone caproate (MAKENA) 250 mg/mL injection 250 mg    Admin Date 03/01/2017 Action Given Dose 250 mg Route Intramuscular Administered By Maretta BeesMcGlashan, Eniya Cannady J, RMA

## 2017-03-10 ENCOUNTER — Ambulatory Visit (INDEPENDENT_AMBULATORY_CARE_PROVIDER_SITE_OTHER): Payer: Medicaid Other | Admitting: Obstetrics & Gynecology

## 2017-03-10 VITALS — BP 113/68 | HR 77 | Wt 186.7 lb

## 2017-03-10 DIAGNOSIS — O09213 Supervision of pregnancy with history of pre-term labor, third trimester: Secondary | ICD-10-CM

## 2017-03-10 DIAGNOSIS — O24410 Gestational diabetes mellitus in pregnancy, diet controlled: Secondary | ICD-10-CM | POA: Diagnosis not present

## 2017-03-10 DIAGNOSIS — O09523 Supervision of elderly multigravida, third trimester: Secondary | ICD-10-CM | POA: Diagnosis not present

## 2017-03-10 DIAGNOSIS — O09529 Supervision of elderly multigravida, unspecified trimester: Secondary | ICD-10-CM

## 2017-03-10 NOTE — Progress Notes (Signed)
   PRENATAL VISIT NOTE  Subjective:  Heather Little is a 36 y.o. [id number] at 8436w5d being seen today for ongoing prenatal care.  She is currently monitored for the following issues for this high-risk pregnancy and has Beta thalassemia trait; History of premature delivery; History of premature rupture of membranes; Incompetency, cervical; Encounter for supervision of high-risk pregnancy with elderly multigravida; Short interval between pregnancies affecting pregnancy, antepartum; and Gestational diabetes mellitus (GDM), antepartum on her problem list.  Patient reports no complaints.  Contractions: Not present. Vag. Bleeding: None.  Movement: Present. Denies leaking of fluid.   The following portions of the patient's history were reviewed and updated as appropriate: allergies, current medications, past family history, past medical history, past social history, past surgical history and problem list. Problem list updated.  Objective:   Vitals:   03/10/17 1558  BP: 113/68  Pulse: 77  Weight: 186 lb 11.2 oz (84.7 kg)    Fetal Status:   Fundal Height: 36 cm Movement: Present     General:  Alert, oriented and cooperative. Patient is in no acute distress.  Skin: Skin is warm and dry. No rash noted.   Cardiovascular: Normal heart rate noted  Respiratory: Normal respiratory effort, no problems with respiration noted  Abdomen: Soft, gravid, appropriate for gestational age. Pain/Pressure: Present     Pelvic:  Cervical exam deferred        Extremities: Normal range of motion.  Edema: None  Mental Status: Normal mood and affect. Normal behavior. Normal judgment and thought content.   Assessment and Plan:  Pregnancy: [id number] at 4236w5d  Patient Active Problem List   Diagnosis Date Noted  . Gestational diabetes mellitus (GDM), antepartum 02/07/2017  . Short interval between pregnancies affecting pregnancy, antepartum 12/27/2016  . Encounter for supervision of high-risk pregnancy with elderly  multigravida 11/01/2016  . History of premature delivery 10/06/2016  . History of premature rupture of membranes 10/06/2016  . Incompetency, cervical 10/06/2016  . Beta thalassemia trait 12/28/2015   Did not bring her meter but states all values below 140 but FBS may be 109 Preterm labor symptoms and general obstetric precautions including but not limited to vaginal bleeding, contractions, leaking of fluid and fetal movement were reviewed in detail with the patient. Please refer to After Visit Summary for other counseling recommendations.  Return in about 1 week (around 03/17/2017) for 17 p.   Scheryl DarterJames Nia Nathaniel, MD

## 2017-03-10 NOTE — Patient Instructions (Signed)
Third Trimester of Pregnancy The third trimester is from week 28 through week 40 (months 7 through 9). The third trimester is a time when the unborn baby (fetus) is growing rapidly. At the end of the ninth month, the fetus is about 20 inches in length and weighs 6-10 pounds. Body changes during your third trimester Your body will continue to go through many changes during pregnancy. The changes vary from woman to woman. During the third trimester:  Your weight will continue to increase. You can expect to gain 25-35 pounds (11-16 kg) by the end of the pregnancy.  You may begin to get stretch marks on your hips, abdomen, and breasts.  You may urinate more often because the fetus is moving lower into your pelvis and pressing on your bladder.  You may develop or continue to have heartburn. This is caused by increased hormones that slow down muscles in the digestive tract.  You may develop or continue to have constipation because increased hormones slow digestion and cause the muscles that push waste through your intestines to relax.  You may develop hemorrhoids. These are swollen veins (varicose veins) in the rectum that can itch or be painful.  You may develop swollen, bulging veins (varicose veins) in your legs.  You may have increased body aches in the pelvis, back, or thighs. This is due to weight gain and increased hormones that are relaxing your joints.  You may have changes in your hair. These can include thickening of your hair, rapid growth, and changes in texture. Some women also have hair loss during or after pregnancy, or hair that feels dry or thin. Your hair will most likely return to normal after your baby is born.  Your breasts will continue to grow and they will continue to become tender. A yellow fluid (colostrum) may leak from your breasts. This is the first milk you are producing for your baby.  Your belly button may stick out.  You may notice more swelling in your hands,  face, or ankles.  You may have increased tingling or numbness in your hands, arms, and legs. The skin on your belly may also feel numb.  You may feel short of breath because of your expanding uterus.  You may have more problems sleeping. This can be caused by the size of your belly, increased need to urinate, and an increase in your body's metabolism.  You may notice the fetus "dropping," or moving lower in your abdomen (lightening).  You may have increased vaginal discharge.  You may notice your joints feel loose and you may have pain around your pelvic bone.  What to expect at prenatal visits You will have prenatal exams every 2 weeks until week 36. Then you will have weekly prenatal exams. During a routine prenatal visit:  You will be weighed to make sure you and the baby are growing normally.  Your blood pressure will be taken.  Your abdomen will be measured to track your baby's growth.  The fetal heartbeat will be listened to.  Any test results from the previous visit will be discussed.  You may have a cervical check near your due date to see if your cervix has softened or thinned (effaced).  You will be tested for Group B streptococcus. This happens between 35 and 37 weeks.  Your health care provider may ask you:  What your birth plan is.  How you are feeling.  If you are feeling the baby move.  If you have had   any abnormal symptoms, such as leaking fluid, bleeding, severe headaches, or abdominal cramping.  If you are using any tobacco products, including cigarettes, chewing tobacco, and electronic cigarettes.  If you have any questions.  Other tests or screenings that may be performed during your third trimester include:  Blood tests that check for low iron levels (anemia).  Fetal testing to check the health, activity level, and growth of the fetus. Testing is done if you have certain medical conditions or if there are problems during the  pregnancy.  Nonstress test (NST). This test checks the health of your baby to make sure there are no signs of problems, such as the baby not getting enough oxygen. During this test, a belt is placed around your belly. The baby is made to move, and its heart rate is monitored during movement.  What is false labor? False labor is a condition in which you feel small, irregular tightenings of the muscles in the womb (contractions) that usually go away with rest, changing position, or drinking water. These are called Braxton Hicks contractions. Contractions may last for hours, days, or even weeks before true labor sets in. If contractions come at regular intervals, become more frequent, increase in intensity, or become painful, you should see your health care provider. What are the signs of labor?  Abdominal cramps.  Regular contractions that start at 10 minutes apart and become stronger and more frequent with time.  Contractions that start on the top of the uterus and spread down to the lower abdomen and back.  Increased pelvic pressure and dull back pain.  A watery or bloody mucus discharge that comes from the vagina.  Leaking of amniotic fluid. This is also known as your "water breaking." It could be a slow trickle or a gush. Let your health care provider know if it has a color or strange odor. If you have any of these signs, call your health care provider right away, even if it is before your due date. Follow these instructions at home: Medicines  Follow your health care provider's instructions regarding medicine use. Specific medicines may be either safe or unsafe to take during pregnancy.  Take a prenatal vitamin that contains at least 600 micrograms (mcg) of folic acid.  If you develop constipation, try taking a stool softener if your health care provider approves. Eating and drinking  Eat a balanced diet that includes fresh fruits and vegetables, whole grains, good sources of protein  such as meat, eggs, or tofu, and low-fat dairy. Your health care provider will help you determine the amount of weight gain that is right for you.  Avoid raw meat and uncooked cheese. These carry germs that can cause birth defects in the baby.  If you have low calcium intake from food, talk to your health care provider about whether you should take a daily calcium supplement.  Eat four or five small meals rather than three large meals a day.  Limit foods that are high in fat and processed sugars, such as fried and sweet foods.  To prevent constipation: ? Drink enough fluid to keep your urine clear or pale yellow. ? Eat foods that are high in fiber, such as fresh fruits and vegetables, whole grains, and beans. Activity  Exercise only as directed by your health care provider. Most women can continue their usual exercise routine during pregnancy. Try to exercise for 30 minutes at least 5 days a week. Stop exercising if you experience uterine contractions.  Avoid heavy   lifting.  Do not exercise in extreme heat or humidity, or at high altitudes.  Wear low-heel, comfortable shoes.  Practice good posture.  You may continue to have sex unless your health care provider tells you otherwise. Relieving pain and discomfort  Take frequent breaks and rest with your legs elevated if you have leg cramps or low back pain.  Take warm sitz baths to soothe any pain or discomfort caused by hemorrhoids. Use hemorrhoid cream if your health care provider approves.  Wear a good support bra to prevent discomfort from breast tenderness.  If you develop varicose veins: ? Wear support pantyhose or compression stockings as told by your healthcare provider. ? Elevate your feet for 15 minutes, 3-4 times a day. Prenatal care  Write down your questions. Take them to your prenatal visits.  Keep all your prenatal visits as told by your health care provider. This is important. Safety  Wear your seat belt at  all times when driving.  Make a list of emergency phone numbers, including numbers for family, friends, the hospital, and police and fire departments. General instructions  Avoid cat litter boxes and soil used by cats. These carry germs that can cause birth defects in the baby. If you have a cat, ask someone to clean the litter box for you.  Do not travel far distances unless it is absolutely necessary and only with the approval of your health care provider.  Do not use hot tubs, steam rooms, or saunas.  Do not drink alcohol.  Do not use any products that contain nicotine or tobacco, such as cigarettes and e-cigarettes. If you need help quitting, ask your health care provider.  Do not use any medicinal herbs or unprescribed drugs. These chemicals affect the formation and growth of the baby.  Do not douche or use tampons or scented sanitary pads.  Do not cross your legs for long periods of time.  To prepare for the arrival of your baby: ? Take prenatal classes to understand, practice, and ask questions about labor and delivery. ? Make a trial run to the hospital. ? Visit the hospital and tour the maternity area. ? Arrange for maternity or paternity leave through employers. ? Arrange for family and friends to take care of pets while you are in the hospital. ? Purchase a rear-facing car seat and make sure you know how to install it in your car. ? Pack your hospital bag. ? Prepare the baby's nursery. Make sure to remove all pillows and stuffed animals from the baby's crib to prevent suffocation.  Visit your dentist if you have not gone during your pregnancy. Use a soft toothbrush to brush your teeth and be gentle when you floss. Contact a health care provider if:  You are unsure if you are in labor or if your water has broken.  You become dizzy.  You have mild pelvic cramps, pelvic pressure, or nagging pain in your abdominal area.  You have lower back pain.  You have persistent  nausea, vomiting, or diarrhea.  You have an unusual or bad smelling vaginal discharge.  You have pain when you urinate. Get help right away if:  Your water breaks before 37 weeks.  You have regular contractions less than 5 minutes apart before 37 weeks.  You have a fever.  You are leaking fluid from your vagina.  You have spotting or bleeding from your vagina.  You have severe abdominal pain or cramping.  You have rapid weight loss or weight gain.    You have shortness of breath with chest pain.  You notice sudden or extreme swelling of your face, hands, ankles, feet, or legs.  Your baby makes fewer than 10 movements in 2 hours.  You have severe headaches that do not go away when you take medicine.  You have vision changes. Summary  The third trimester is from week 28 through week 40, months 7 through 9. The third trimester is a time when the unborn baby (fetus) is growing rapidly.  During the third trimester, your discomfort may increase as you and your baby continue to gain weight. You may have abdominal, leg, and back pain, sleeping problems, and an increased need to urinate.  During the third trimester your breasts will keep growing and they will continue to become tender. A yellow fluid (colostrum) may leak from your breasts. This is the first milk you are producing for your baby.  False labor is a condition in which you feel small, irregular tightenings of the muscles in the womb (contractions) that eventually go away. These are called Braxton Hicks contractions. Contractions may last for hours, days, or even weeks before true labor sets in.  Signs of labor can include: abdominal cramps; regular contractions that start at 10 minutes apart and become stronger and more frequent with time; watery or bloody mucus discharge that comes from the vagina; increased pelvic pressure and dull back pain; and leaking of amniotic fluid. This information is not intended to replace advice  given to you by your health care provider. Make sure you discuss any questions you have with your health care provider. Document Released: 08/17/2001 Document Revised: 01/29/2016 Document Reviewed: 10/24/2012 Elsevier Interactive Patient Education  2017 [employer]  

## 2017-03-10 NOTE — Progress Notes (Signed)
ROB/17P.  17P given in LUOQ. Tolerated well. Administrations This Visit    hydroxyprogesterone caproate (MAKENA) 250 mg/mL injection 250 mg    Admin Date 03/10/2017 Action Given Dose 250 mg Route Intramuscular Administered By Maretta BeesMcGlashan, Chandel Zaun J, RMA

## 2017-03-15 ENCOUNTER — Encounter (HOSPITAL_COMMUNITY): Payer: Self-pay

## 2017-03-15 ENCOUNTER — Inpatient Hospital Stay (HOSPITAL_COMMUNITY)
Admission: AD | Admit: 2017-03-15 | Discharge: 2017-03-17 | DRG: 775 | Disposition: A | Discharge status: Home / Self Care | Payer: Medicaid Other | Source: Ambulatory Visit | Attending: Family Medicine | Admitting: Family Medicine

## 2017-03-15 ENCOUNTER — Ambulatory Visit: Payer: Medicaid Other

## 2017-03-15 DIAGNOSIS — D561 Beta thalassemia: Secondary | ICD-10-CM | POA: Diagnosis present

## 2017-03-15 DIAGNOSIS — O42013 Preterm premature rupture of membranes, onset of labor within 24 hours of rupture, third trimester: Principal | ICD-10-CM | POA: Diagnosis present

## 2017-03-15 DIAGNOSIS — O24420 Gestational diabetes mellitus in childbirth, diet controlled: Secondary | ICD-10-CM | POA: Diagnosis present

## 2017-03-15 DIAGNOSIS — O24429 Gestational diabetes mellitus in childbirth, unspecified control: Secondary | ICD-10-CM

## 2017-03-15 DIAGNOSIS — O42919 Preterm premature rupture of membranes, unspecified as to length of time between rupture and onset of labor, unspecified trimester: Secondary | ICD-10-CM | POA: Diagnosis present

## 2017-03-15 DIAGNOSIS — O9902 Anemia complicating childbirth: Secondary | ICD-10-CM | POA: Diagnosis present

## 2017-03-15 DIAGNOSIS — Z3A35 35 weeks gestation of pregnancy: Secondary | ICD-10-CM

## 2017-03-15 DIAGNOSIS — O3433 Maternal care for cervical incompetence, third trimester: Secondary | ICD-10-CM | POA: Diagnosis present

## 2017-03-15 HISTORY — DX: Gestational diabetes mellitus in pregnancy, unspecified control: O24.419

## 2017-03-15 LAB — RAPID URINE DRUG SCREEN, HOSP PERFORMED
Amphetamines: NOT DETECTED
BARBITURATES: NOT DETECTED
Benzodiazepines: NOT DETECTED
Cocaine: NOT DETECTED
Opiates: NOT DETECTED
TETRAHYDROCANNABINOL: NOT DETECTED

## 2017-03-15 LAB — CBC
HCT: 29.3 % — ABNORMAL LOW (ref 36.0–46.0)
HEMOGLOBIN: 9.5 g/dL — AB (ref 12.0–15.0)
MCH: 22.8 pg — AB (ref 26.0–34.0)
MCHC: 32.4 g/dL (ref 30.0–36.0)
MCV: 70.4 fL — ABNORMAL LOW (ref 78.0–100.0)
PLATELETS: 196 10*3/uL (ref 150–400)
RBC: 4.16 MIL/uL (ref 3.87–5.11)
RDW: 15.1 % (ref 11.5–15.5)
WBC: 8.3 10*3/uL (ref 4.0–10.5)

## 2017-03-15 LAB — AMNISURE RUPTURE OF MEMBRANE (ROM) NOT AT ARMC: Amnisure ROM: POSITIVE

## 2017-03-15 LAB — URINALYSIS, ROUTINE W REFLEX MICROSCOPIC
BACTERIA UA: NONE SEEN
Bilirubin Urine: NEGATIVE
GLUCOSE, UA: 50 mg/dL — AB
Hgb urine dipstick: NEGATIVE
KETONES UR: NEGATIVE mg/dL
Leukocytes, UA: NEGATIVE
NITRITE: NEGATIVE
PROTEIN: 30 mg/dL — AB
Specific Gravity, Urine: 1.02 (ref 1.005–1.030)
pH: 5 (ref 5.0–8.0)

## 2017-03-15 LAB — GLUCOSE, CAPILLARY
GLUCOSE-CAPILLARY: 79 mg/dL (ref 65–99)
GLUCOSE-CAPILLARY: 107 mg/dL — AB (ref 65–99)
GLUCOSE-CAPILLARY: 93 mg/dL (ref 65–99)

## 2017-03-15 LAB — TYPE AND SCREEN
ABO/RH(D): B POS
ANTIBODY SCREEN: NEGATIVE

## 2017-03-15 MED ORDER — MISOPROSTOL 50MCG HALF TABLET
50.0000 ug | ORAL_TABLET | ORAL | Status: DC | PRN
  Filled 2017-03-15: qty 1

## 2017-03-15 MED ORDER — PRENATAL MULTIVITAMIN CH
1.0000 | ORAL_TABLET | Freq: Every day | ORAL | Status: DC
  Administered 2017-03-16 – 2017-03-17 (×2): 1 via ORAL
  Filled 2017-03-15 (×2): qty 1

## 2017-03-15 MED ORDER — PENICILLIN G POTASSIUM 5000000 UNITS IJ SOLR
5.0000 10*6.[IU] | Freq: Once | INTRAVENOUS | Status: AC
  Administered 2017-03-15: 5 10*6.[IU] via INTRAVENOUS
  Filled 2017-03-15: qty 5

## 2017-03-15 MED ORDER — DIBUCAINE 1 % RE OINT: 1.0000 "application " | TOPICAL_OINTMENT | RECTAL | Status: DC | PRN

## 2017-03-15 MED ORDER — LACTATED RINGERS IV SOLN
INTRAVENOUS | Status: DC
Start: 2017-03-15 — End: 2017-03-16
  Administered 2017-03-15: 14:00:00 via INTRAVENOUS

## 2017-03-15 MED ORDER — LACTATED RINGERS IV SOLN: INTRAVENOUS | Status: DC

## 2017-03-15 MED ORDER — IBUPROFEN 600 MG PO TABS
600.0000 mg | ORAL_TABLET | Freq: Four times a day (QID) | ORAL | Status: DC
  Administered 2017-03-15 – 2017-03-17 (×7): 600 mg via ORAL
  Filled 2017-03-15 (×7): qty 1

## 2017-03-15 MED ORDER — OXYTOCIN 40 UNITS IN LACTATED RINGERS INFUSION - SIMPLE MED
1.0000 m[IU]/min | INTRAVENOUS | Status: DC
  Administered 2017-03-15: 2 m[IU]/min via INTRAVENOUS
  Administered 2017-03-15: 12 m[IU]/min via INTRAVENOUS
  Filled 2017-03-15: qty 1000

## 2017-03-15 MED ORDER — ONDANSETRON HCL 4 MG/2ML IJ SOLN: 4.0000 mg | Freq: Four times a day (QID) | INTRAMUSCULAR | Status: DC | PRN

## 2017-03-15 MED ORDER — TETANUS-DIPHTH-ACELL PERTUSSIS 5-2.5-18.5 LF-MCG/0.5 IM SUSP
0.5000 mL | Freq: Once | INTRAMUSCULAR | Status: AC
  Administered 2017-03-16: 0.5 mL via INTRAMUSCULAR

## 2017-03-15 MED ORDER — PENICILLIN G POT IN DEXTROSE 60000 UNIT/ML IV SOLN
3.0000 10*6.[IU] | INTRAVENOUS | Status: DC
  Administered 2017-03-15: 3 10*6.[IU] via INTRAVENOUS
  Filled 2017-03-15 (×5): qty 50

## 2017-03-15 MED ORDER — ACETAMINOPHEN 325 MG PO TABS: 650.0000 mg | ORAL_TABLET | ORAL | Status: DC | PRN

## 2017-03-15 MED ORDER — LIDOCAINE HCL (PF) 1 % IJ SOLN
30.0000 mL | INTRAMUSCULAR | Status: DC | PRN
  Administered 2017-03-15: 30 mL via SUBCUTANEOUS
  Filled 2017-03-15: qty 30

## 2017-03-15 MED ORDER — ZOLPIDEM TARTRATE 5 MG PO TABS: 5.0000 mg | ORAL_TABLET | Freq: Every evening | ORAL | Status: DC | PRN

## 2017-03-15 MED ORDER — SENNOSIDES-DOCUSATE SODIUM 8.6-50 MG PO TABS
2.0000 | ORAL_TABLET | ORAL | Status: DC
  Administered 2017-03-15 – 2017-03-17 (×2): 2 via ORAL
  Filled 2017-03-15 (×2): qty 2

## 2017-03-15 MED ORDER — WITCH HAZEL-GLYCERIN EX PADS: 1.0000 "application " | MEDICATED_PAD | CUTANEOUS | Status: DC | PRN

## 2017-03-15 MED ORDER — SOD CITRATE-CITRIC ACID 500-334 MG/5ML PO SOLN: 30.0000 mL | ORAL | Status: DC | PRN

## 2017-03-15 MED ORDER — OXYTOCIN BOLUS FROM INFUSION
500.0000 mL | Freq: Once | INTRAVENOUS | Status: AC
  Administered 2017-03-15: 500 mL via INTRAVENOUS

## 2017-03-15 MED ORDER — COCONUT OIL OIL: 1.0000 "application " | TOPICAL_OIL | Status: DC | PRN

## 2017-03-15 MED ORDER — TERBUTALINE SULFATE 1 MG/ML IJ SOLN
0.2500 mg | Freq: Once | INTRAMUSCULAR | Status: DC | PRN
  Filled 2017-03-15: qty 1

## 2017-03-15 MED ORDER — DIPHENHYDRAMINE HCL 25 MG PO CAPS: 25.0000 mg | ORAL_CAPSULE | Freq: Four times a day (QID) | ORAL | Status: DC | PRN

## 2017-03-15 MED ORDER — OXYCODONE-ACETAMINOPHEN 5-325 MG PO TABS: 1.0000 | ORAL_TABLET | ORAL | Status: DC | PRN

## 2017-03-15 MED ORDER — FLEET ENEMA 7-19 GM/118ML RE ENEM: 1.0000 | ENEMA | RECTAL | Status: DC | PRN

## 2017-03-15 MED ORDER — BENZOCAINE-MENTHOL 20-0.5 % EX AERO: 1.0000 "application " | INHALATION_SPRAY | CUTANEOUS | Status: DC | PRN

## 2017-03-15 MED ORDER — LACTATED RINGERS IV SOLN: 500.0000 mL | INTRAVENOUS | Status: DC | PRN

## 2017-03-15 MED ORDER — OXYTOCIN 40 UNITS IN LACTATED RINGERS INFUSION - SIMPLE MED
2.5000 [IU]/h | INTRAVENOUS | Status: DC
  Administered 2017-03-15: 2.5 [IU]/h via INTRAVENOUS

## 2017-03-15 MED ORDER — OXYCODONE-ACETAMINOPHEN 5-325 MG PO TABS: 2.0000 | ORAL_TABLET | ORAL | Status: DC | PRN

## 2017-03-15 MED ORDER — SIMETHICONE 80 MG PO CHEW: 80.0000 mg | CHEWABLE_TABLET | ORAL | Status: DC | PRN

## 2017-03-15 MED ORDER — ONDANSETRON HCL 4 MG PO TABS: 4.0000 mg | ORAL_TABLET | ORAL | Status: DC | PRN

## 2017-03-15 MED ORDER — ONDANSETRON HCL 4 MG/2ML IJ SOLN: 4.0000 mg | INTRAMUSCULAR | Status: DC | PRN

## 2017-03-15 NOTE — MAU Provider Note (Signed)
S: Ms. Heather Little is a 36 y.o. [phone] at 4936w3d  who presents to MAU today complaining of leaking of fluid since 10 pm last night. She denies vaginal bleeding. She denies contractions. She reports normal fetal movement.    O: BP 128/60 (BP Location: Right Arm)   Pulse 90   Temp 98.5 F (36.9 C) (Oral)   Resp 16   Wt 185 lb (83.9 kg)   LMP 07/03/2016   SpO2 100%   BMI 31.76 kg/m  GENERAL: Well-developed, well-nourished female in no acute distress.  HEAD: Normocephalic, atraumatic.  CHEST: Normal effort of breathing, regular heart rate ABDOMEN: Soft, nontender, gravid    Cervical exam:  Dilation: Closed Effacement (%): Thick Cervical Position: Anterior Exam by:: J. Laurier NancyMcClellan, RN   Fetal Monitoring: Baseline: 150 Variability: moderate Accelerations: 15x15 Decelerations: none Contractions: irr ctx & UI  Results for orders placed or performed during the hospital encounter of 03/15/17 (from the past 24 hour(s))  [facility]     Status: None   Collection Time: 03/15/17 12:52 PM  Result Value Ref Range   Amnisure ROM POSITIVE    SVE & fern swab performed by RN prior to my knowledge of patient. Pelvic exam deferred. Amnisure obtained & positive.     A: SIUP at 7736w3d  SROM 1. Preterm premature rupture of membranes (PPROM) with unknown onset of labor     P: Admit to birthing suites Care turned over to Dr. Geronimo BootWouk  Heather Little, Denny PeonErin, NP 03/15/2017 1:29 PM

## 2017-03-15 NOTE — Progress Notes (Signed)
Patient ID: Benard Rinkenneh Granito, female   DOB: 1981/01/24, 36 y.o.   MRN: [id number] Labor Progress Note  S: Patient seen & examined for progress of labor. Patient comfortable, not planning on epidural.   O: BP 118/63   Pulse 81   Temp 98 F (36.7 C) (Oral)   Resp 16   Ht 5\' 4"  (1.626 m)   Wt 83.9 kg (185 lb)   LMP 07/03/2016   SpO2 100%   BMI 31.76 kg/m   FHT: 145bpm, mod var, +accels, no decels TOCO: q2-5 min, patient looks comfortable during contractions  CVE: Dilation: 4.5 Effacement (%): 70 Cervical Position: Anterior Station: -3 Presentation: Vertex Exam by:: Dr Ashok PallWouk  A&P: 36 y.o. [id number] 8173w3d, PPROM @2200  7/9, A1GDM, and beta thalassemia trait.  Currently on 208milli-units/ min of pitocin GBS unknown- PCN Continue currentt management Anticipate SVD  SwazilandJordan Argyle Gustafson, DO FM Resident PGY-1 03/15/2017 6:49 PM

## 2017-03-15 NOTE — Progress Notes (Addendum)
g6P1 @ 35.[redacted] wksga. Presents to triage for r/o ROM. States ruptured 2200 last night. Thought is was urine but states cont leaking. Denies bleeding. +FM. EFM applied. VSS see flow sheet for details.   Urine and Fern test ordered.  Negative fern   SVE closed.   1256: aminisure done.   1322: Amnisure + Provider aware.   1352: Labs and IV started.   Report given to birthing charge nurse by MAU charge.  1415: Pt to birthing via wheelchair w family.

## 2017-03-15 NOTE — H&P (Signed)
LABOR ADMISSION HISTORY AND PHYSICAL  Heather Little is a 36 y.o. female [phone] with IUP at 54w3dby [place] for PPROM _0  on 03/14/2017 and A1GDM. She has a h/o of preterm birth at 257 weekswith fetal demise, receiving 17-OHP injections this pregnancy. Patient has beta-thalassemia trait. She reports +FMs, LOF at 2200 07/09 with clear fluid, no VB, no blurry vision, headaches or peripheral edema, and RUQ pain.  She plans on breast and bottle feeding. She is unsure about birth control.  Dating: By [place]--->  Estimated Date of Delivery: 04/16/17    Prenatal History/Complications:  Past Medical History: Past Medical History:  Diagnosis Date  . GDM (gestational diabetes mellitus)    diet controlled    Past Surgical History: Past Surgical History:  Procedure Laterality Date  . CERVICAL CERCLAGE  2017  . ECTOPIC PREGNANCY SURGERY      Obstetrical History: OB History    Gravida Para Term Preterm AB Living   _1 SAB TAB Ectopic Multiple Live Births   0 2 1 0 2      Social History: Social History   Social History  . Marital status: Married    Spouse name: N/A  . Number of children: N/A  . Years of education: N/A   Social History Main Topics  . Smoking status: Never Smoker  . Smokeless tobacco: Never Used  . Alcohol use No  . Drug use: No  . Sexual activity: Not Currently    Birth control/ protection: None   Other Topics Concern  . None   Social History Narrative  . None    Family History: History reviewed. No pertinent family history.  Allergies: No Known Allergies  Prescriptions Prior to Admission  Medication Sig Dispense Refill Last Dose  . benzocaine (ORAJEL) 10 % mucosal gel Use as directed 1 application in the mouth or throat as needed for mouth pain.   03/14/2017 at Unknown time  . Prenatal Vit-Fe Fumarate-FA (PRENATAL MULTIVITAMIN) TABS tablet Take 1 tablet by mouth daily at 12 noon.   03/14/2017 at Unknown time  . ACCU-CHEK FASTCLIX LANCETS  MISC 1 each by Does not apply route 4 (four) times daily. 100 each 5   . Blood Glucose Monitoring Suppl (ACCU-CHEK AVIVA PLUS) w/Device KIT 1 kit by Does not apply route 4 (four) times daily. 1 kit 0   . glucose blood (ACCU-CHEK AVIVA PLUS) test strip Use as instructed 100 each 12      Review of Systems   All systems reviewed and negative except as stated in HPI  Blood pressure 128/60, pulse 90, temperature 98.1 F (36.7 C), temperature source Oral, resp. rate 16, height _2  (1.626 m), weight 83.9 kg (185 lb), last menstrual period 07/03/2016, SpO2 100 %, unknown if currently breastfeeding. General appearance: alert, cooperative and no distress Extremities: Homans sign is negative, no sign of DVT Presentation: cephalic Fetal monitoringBaseline: 150 bpm, Variability: Good {> 6 bpm), Accelerations: Reactive and Decelerations: Absent Uterine activityFrequency: Irregular    Prenatal labs: ABO, Rh: B/Positive/-- (01/31 1616) Antibody: Negative (01/31 1616) Rubella: immune RPR: Non Reactive (05/21 1040)  HBsAg: Negative (01/31 1616)  HIV: Non Reactive (05/21 1040)  GBS:   neg   Prenatal Transfer Tool  Maternal Diabetes: Yes:  Diabetes Type:  Diet controlled Genetic Screening: NIPS- low risk Maternal Ultrasounds/Referrals: Normal Fetal Ultrasounds or other Referrals:  None Maternal Substance Abuse:  No Significant Maternal Medications:  Meds include: Other: 17-OHP Significant Maternal  Lab Results: GBS unknown  Results for orders placed or performed during the hospital encounter of 03/15/17 (from the past 24 hour(s))  Urinalysis, Routine w reflex microscopic   Collection Time: 03/15/17 12:09 PM  Result Value Ref Range   Color, Urine YELLOW YELLOW   APPearance CLEAR CLEAR   Specific Gravity, Urine 1.020 1.005 - 1.030   pH 5.0 5.0 - 8.0   Glucose, UA 50 (A) NEGATIVE mg/dL   Hgb urine dipstick NEGATIVE NEGATIVE   Bilirubin Urine NEGATIVE NEGATIVE   Ketones, ur NEGATIVE  NEGATIVE mg/dL   Protein, ur 30 (A) NEGATIVE mg/dL   Nitrite NEGATIVE NEGATIVE   Leukocytes, UA NEGATIVE NEGATIVE   RBC / HPF 0-5 0 - 5 RBC/hpf   WBC, UA 0-5 0 - 5 WBC/hpf   Bacteria, UA NONE SEEN NONE SEEN   Squamous Epithelial / LPF 0-5 (A) NONE SEEN   Mucous PRESENT    Ca Oxalate Crys, UA PRESENT   Amnisure rupture of membrane (rom)not at [facility]   Collection Time: 03/15/17 12:52 PM  Result Value Ref Range   Amnisure ROM POSITIVE   CBC   Collection Time: 03/15/17  1:52 PM  Result Value Ref Range   WBC 8.3 4.0 - 10.5 K/uL   RBC 4.16 3.87 - 5.11 MIL/uL   Hemoglobin 9.5 (L) 12.0 - 15.0 g/dL   HCT 29.3 (L) 36.0 - 46.0 %   MCV 70.4 (L) 78.0 - 100.0 fL   MCH 22.8 (L) 26.0 - 34.0 pg   MCHC 32.4 30.0 - 36.0 g/dL   RDW 15.1 11.5 - 15.5 %   Platelets 196 150 - 400 K/uL    Patient Active Problem List   Diagnosis Date Noted  . Preterm premature rupture of membranes (PPROM) with unknown onset of labor 03/15/2017  . Gestational diabetes mellitus (GDM), antepartum 02/07/2017  . Short interval between pregnancies affecting pregnancy, antepartum 12/27/2016  . Encounter for supervision of high-risk pregnancy with elderly multigravida 11/01/2016  . History of premature delivery 10/06/2016  . History of premature rupture of membranes 10/06/2016  . Incompetency, cervical 10/06/2016  . Beta thalassemia trait 12/28/2015    Assessment: Heather Little is a 36 y.o. [id number] at 86w3dhere for PPROM _0  on 7/9 with clear fluid. patient had been receiving 17-OHP injections for h/o of PPROM and preterm birth at 260 weeks  #Labor: Start pitocin #Pain: Epidural upon request, does not plan to use  #FWB: Cat 1 #ID: GBS unknown- PCN #MOF: Breast and bottle #MOC: unsure #Circ:  N/A #A1GDM: CBG q4 hours  JMartiniqueShirley, DO Family Medicine Resident PGY-1  03/15/2017, 2:42 PM

## 2017-03-15 NOTE — MAU Note (Signed)
About 2200 last night, started having some leakage.  Clear fluid, continues to leak.  No bleeding. No pain.

## 2017-03-16 LAB — GLUCOSE, CAPILLARY: Glucose-Capillary: 104 mg/dL — ABNORMAL HIGH (ref 65–99)

## 2017-03-16 LAB — RPR: RPR: NONREACTIVE

## 2017-03-16 NOTE — Lactation Note (Signed)
This note was copied from a baby's chart. Lactation Consultation Note 35 3/7 weeks at 8 hours old, baby has had low temperature and low blood sugar. Baby BF after delivery, then has been supplemented d/t low bld sugar and temp baby under warmer. Baby is spitting up out of nose causing congestion. Put to breast, took a couple of sucks then stopped. No interest in BF. Mom has older child that she tried to BF but couldn't because the baby wouldn't BF. FOB stated "she was lazy and wouldn't feed on breast".  Mom pumped and bottle fed BM for a couple of months.  Nursery RN had set up DEBP, Mom shown how to use DEBP & how to disassemble, clean, & reassemble parts. Mom to post pump 15 min. After BF. Mom encouraged to feed baby 8-12 times/24 hours and with feeding cues. Wake baby if hasn't cued to eat every 3 hours. Alert RN if no interest in feeding or if to sleepy to wake.  Mom given supplement feeding sheet and LPI care sheet. Mom plans to breast and bottle feed.  [facility]. Asked mom what was her plan for feeding when she went home. She stated both.   WH/LC brochure given w/resources, support groups and LC services. Patient Name: Girl Benard Rinkenneh Gumina BJYNW'GToday's Date: 03/16/2017 Reason for consult: Initial assessment;Late preterm infant;Infant < 6lbs   Maternal Data Has patient been taught Hand Expression?: Yes Does the patient have breastfeeding experience prior to this delivery?: Yes  Feeding Feeding Type: Formula Nipple Type: Slow - flow Length of feed: 0 min  LATCH Score/Interventions Latch: Too sleepy or reluctant, no latch achieved, no sucking elicited.     Type of Nipple: Everted at rest and after stimulation  Comfort (Breast/Nipple): Soft / non-tender     Hold (Positioning): Full assist, staff holds infant at breast Intervention(s): Breastfeeding basics reviewed;Support Pillows;Position options;Skin to skin     Lactation Tools  Discussed/Used Tools: Pump Breast pump type: Double-Electric Breast Pump Pump Review: Setup, frequency, and cleaning;Milk Storage Initiated by:: RN Date initiated:: 03/16/17   Consult Status Consult Status: Follow-up Date: 03/16/17 Follow-up type: In-patient    Charyl DancerCARVER, Joushua Dugar G 03/16/2017, 4:42 AM

## 2017-03-16 NOTE — Progress Notes (Addendum)
Post Partum Day #1 Subjective: no complaints, up ad lib and tolerating PO; breast and bottlefeeding; desires Nexplanon for contraception  Objective: Blood pressure 108/89, pulse 94, temperature 98.2 F (36.8 C), temperature source Oral, resp. rate 17, height 5\' 4"  (1.626 m), weight 83.9 kg (185 lb), last menstrual period 07/03/2016, SpO2 100 %, unknown if currently breastfeeding.  Physical Exam:  General: alert, cooperative and no distress Lochia: appropriate Uterine Fundus: firm DVT Evaluation: No evidence of DVT seen on physical exam.  FBS: 104   Recent Labs  03/15/17 1352  HGB 9.5*  HCT 29.3*    Assessment/Plan: Plan for discharge tomorrow   LOS: 1 day   Cam HaiSHAW, Treniya Lobb CNM 03/16/2017, 7:53 AM

## 2017-03-17 LAB — GC/CHLAMYDIA PROBE AMP (~~LOC~~) NOT AT ARMC
CHLAMYDIA, DNA PROBE: NEGATIVE
Neisseria Gonorrhea: NEGATIVE

## 2017-03-17 LAB — BIRTH TISSUE RECOVERY COLLECTION (PLACENTA DONATION)

## 2017-03-17 MED ORDER — IBUPROFEN 600 MG PO TABS
600.0000 mg | ORAL_TABLET | Freq: Four times a day (QID) | ORAL | 0 refills | Status: AC
Start: 2017-03-17 — End: ?

## 2017-03-17 NOTE — Lactation Note (Signed)
This note was copied from a baby's chart. Lactation Consultation Note  Patient Name: Heather Benard Rinkenneh Lunz FIEPP'IToday's Date: 03/17/2017  Mom is putting baby to breast and then supplementing with formula.  Mom is post pumping for breast stimulation.  She will continue plan and come back for an outpatient appointment Wednesday 03/23/17 2:00 PM.  Mom states she has a DEBP at home.   Maternal Data    Feeding    LATCH Score/Interventions                      Lactation Tools Discussed/Used     Consult Status      Huston FoleyMOULDEN, Saeed Toren S 03/17/2017, 11:54 AM

## 2017-03-17 NOTE — Clinical Social Work Maternal (Signed)
  CLINICAL SOCIAL WORK MATERNAL/CHILD NOTE  Patient Details  Name: Heather Little MRN: [id number] Date of Birth: 03/12/1981  Date:  03/17/2017  Clinical Social Worker Initiating Note:  Lynea Rollison Boyd-Gilyard Date/ Time Initiated:  03/17/17/1031     Child's Name:  Heather Little   Legal Guardian:  Mother   Need for Interpreter:  None   Date of Referral:  03/17/17     Reason for Referral:  Behavioral Health Issues, including SI  (Flat affect)   Referral Source:  Central Nursery   Address:  [address]  Phone number:  [phone]   Household Members:  Self, Minor Children, Spouse   Natural Supports (not living in the home):  Immediate Family (FOB's immediate and extended family will be a source of support. )   Professional Supports: None   Employment: Unemployed   Type of Work:     Education:  High school graduate   Financial Resources:  Medicaid   Other Resources:  WIC, Food Stamps    Cultural/Religious Considerations Which May Impact Care:  None Reported  Strengths:  Ability to meet basic needs , Pediatrician chosen , Home prepared for child    Risk Factors/Current Problems:  None   Cognitive State:  Able to Concentrate , Alert , Linear Thinking , Insightful    Mood/Affect:  Calm , Flat , Relaxed , Interested , Comfortable    CSW Assessment: CSW met with [name] to complete a consult for [name]'s flat affect.  When CSW arrived [name] was resting in bed, FOB was asleep in the recliner, and [name] was on the couch on the Ipad. [name] gave CSW permission to complete clinical assessment while guest were present.    CSW explained CSW's role and encouraged [name] to ask questions.  CSW inquired about [name]'s thoughts and feelings and [name] reported feeling "Good".  [name] denied hx of MH concerns including PPD. CSW provided education regarding Baby Blues vs PMADs and provided [name] with information about support groups held at [facility].  CSW encouraged [name]  to evaluate her mental health throughout the postpartum period with the use of the New Mom Checklist developed by Postpartum Progress and notify a medical professional if symptoms arise.    CSW identifies no further need for intervention at this time or barriers to discharge.  Zen Cedillos Boyd-Gilyard, MSW, LCSW Clinical Social Work [phone]  CSW Plan/Description:  Information/Referral to Community Resources , Patient/Family Education , No Further Intervention Required/No Barriers to Discharge    Aslynn Brunetti D BOYD-GILYARD, LCSW 03/17/2017, 10:34 AM 

## 2017-03-17 NOTE — Progress Notes (Signed)
Discharge teaching for mom completed using teach back, no questions or concerns stated by patient.

## 2017-03-17 NOTE — Discharge Summary (Signed)
OB Discharge Summary     Patient Name: Caidance Sybert DOB: 02/23/1981 MRN: [id number]  Date of admission: 03/15/2017 Delivering MD: Laury Deep   Date of discharge: 03/17/2017  Admitting diagnosis: 25WKS,LEAKING Intrauterine pregnancy: [redacted]w[redacted]d    Secondary diagnosis:  Active Problems:   * No active hospital problems. *  Additional problems: PPROM, Beta Thalassemia, gestational diabetes     Discharge diagnosis: Preterm Pregnancy Delivered                                                                                                Post partum procedures:none  Augmentation: none  Complications: None  Hospital course:  Onset of Labor With Vaginal Delivery     36y.o. yo [id number] 362w3das admitted in Active Labor on 03/15/2017. Patient had an uncomplicated labor course as follows:  Membrane Rupture Time/Date: 10:00 PM ,03/14/2017   Intrapartum Procedures: Episiotomy: None [1]                                         Lacerations:  Vaginal [6]  Patient had a delivery of a Viable infant. 03/15/2017  Information for the patient's newborn:  TaEstalene, Bergey0[[id number]]Delivery Method: Vag-Spont    Pateint had an uncomplicated postpartum course.  She is ambulating, tolerating a regular diet, passing flatus, and urinating well. Patient is discharged home in stable condition on 03/17/17.   Physical exam  Vitals:   03/16/17 0325 03/16/17 1110 03/16/17 1835 03/17/17 0529  BP: 108/89 103/62 113/60 (!) 99/53  Pulse: 94 63 65 64  Resp: 17 17 18 18   Temp: 98.2 F (36.8 C) 98.1 F (36.7 C) 98.8 F (37.1 C) 97.7 F (36.5 C)  TempSrc: Oral Oral Oral Oral  SpO2:  100%    Weight:      Height:       General: alert, cooperative and no distress Lochia: appropriate Uterine Fundus: firm Incision: N/A DVT Evaluation: No evidence of DVT seen on physical exam. Negative Homan's sign. Labs: Lab Results  Component Value Date   WBC 8.3 03/15/2017   HGB 9.5 (L) 03/15/2017   HCT  29.3 (L) 03/15/2017   MCV 70.4 (L) 03/15/2017   PLT 196 03/15/2017   No flowsheet data found.  Discharge instruction: per After Visit Summary and "Baby and Me Booklet".  After visit meds:  Allergies as of 03/17/2017   No Known Allergies     Medication List    STOP taking these medications   ACCU-CHEK AVIVA PLUS w/Device Kit   ACCU-CHEK FASTCLIX LANCETS Misc   benzocaine 10 % mucosal gel Commonly known as:  ORAJEL   glucose blood test strip Commonly known as:  ACCU-CHEK AVIVA PLUS     TAKE these medications   ibuprofen 600 MG tablet Commonly known as:  ADVIL,MOTRIN Take 1 tablet (600 mg total) by mouth every 6 (six) hours.   prenatal multivitamin Tabs tablet Take 1 tablet by mouth daily at 12 noon.  Diet: routine diet; will need 2 hour GCT at postpartum visit.  Activity: Advance as tolerated. Pelvic rest for 6 weeks.   Outpatient follow up:4-5 Follow up Appt:No future appointments. Follow up Visit:No Follow-up on file.  Postpartum contraception: Nexplanon  Newborn Data: Live born female  Birth Weight: 5 lb 2.4 oz (2335 g) APGAR: 9, 9  Baby Feeding: Bottle and Breast Disposition:Peds to decide   03/17/2017 Silas Sacramento, MD

## 2017-03-22 ENCOUNTER — Encounter (HOSPITAL_COMMUNITY): Payer: Self-pay

## 2017-03-22 ENCOUNTER — Ambulatory Visit (HOSPITAL_COMMUNITY): Payer: Medicaid Other

## 2017-03-23 ENCOUNTER — Ambulatory Visit: Payer: Self-pay

## 2017-03-23 NOTE — Lactation Note (Addendum)
This note was copied from a baby's chart. Lactation Consult  Mother's reason for visit:  "LC's at hospital made appointment" Infant is LPTI, GA 35.3.     Visit Type:  Outpatient Appointment Notes:  Infant is 58 days old with adjusted age of 29,4.  Parents could not remember last weight at [facility] office.  BW on 03/15/17 was 5 lbs, 2.4 oz; Discharge weight on 03/17/17 was 5 lbs, 0.6 oz.  Today's weight on 03/23/17 was 5 lbs, 3.2 oz.  Infant has regained birthweight and met .5 oz minimum weight gain per day.  LC had lots of difficulty getting history from parents but after many questions was able to clarify feeding and pumping history.  Mom had flat affect, short answers, and never smiled during consult.  FOB present along with 7 yr old sibling.  FOB was able to help clarify feeding history and had a pleasant demeanor.  Written instructions given at end of consult in addition to an outpatient follow-up appointment.   Consult:  Follow-Up Lactation Consultant:  Heather Little  ________________________________________________________________________ Heather Little Name:  Heather Little Date of Birth:  03/15/2016 Pediatrician:  [facility] (Per parents - Parents could not remember name of Pediatrician) - called parents at home and mom found the name Heather Little (Pediatric NP).   Gender:  female Gestational Age: [redacted]w[redacted]d (At Birth) Birth Weight:  5 lb 2.4 oz (2335 g)   ________________________________________________________________________  Mother's Name: Heather Little (DOB 1981/01/22) Type of delivery:  Vaginal, Spontaneous Delivery Breastfeeding Experience:  P2 Maternal Medical Conditions:  Gestational diabetes mellitis Maternal Medications:  PNV, Ibuprofen  ________________________________________________________________________  Breastfeeding History (Post Discharge)  Frequency of breastfeeding:  Every 3 hrs Duration of feeding:  15-30 min    Pumping  Type of  pump:  Manual Evenflow Frequency:  After each feeding Volume:  60-80 ml  [facility] Referral completed and faxed to [facility]  Mom has an Evenflow double pump but stated that pump does not get anything out so she has been using the Evenflow HP she has.  Encouraged mom to get DEBP from [facility] for a better and more consistent pump.    Infant Intake and Output Assessment  Voids:  6+ in 24 hrs.  Color:  Clear yellow Stools:  6 in 24 hrs.  Color:  Yellow  (parents unsure of whether it is seedy or not)  ________________________________________________________________________  Maternal Breast Assessment  Breast:  Filling and Compressible Nipple:  Erect Pain level:  0 Pain interventions:  n/a  _______________________________________________________________________ Feeding Assessment/Evaluation  Initial feeding assessment  Positioning:  Cradle Left breast  LATCH documentation:  Latch:  2 = Grasps breast easily, tongue down, lips flanged, rhythmical sucking.  Audible swallowing:  2 = Spontaneous and intermittent  Type of nipple:  2 = Everted at rest and after stimulation  Comfort (Breast/Nipple):  2 = Soft / non-tender  Hold (Positioning):  1 = Assistance needed to correctly position infant at breast and maintain latch  LATCH score:  9  Attached assessment:  Shallow  Lips flanged:  No.  Lips untucked:  No.  Suck assessment:  Nutritive   LC taught mom how to use cross-cradle hold, but mom did not use when infant came off and she re-latched.  Taught mom how to flange bottom lips.  Mom did independently flange lips when infant came off breast and mom put infant back on breast.  Encouraged mom to hold infant close with nose touching.  Explained need for wide mouth  and flanged lips to effectively transfer milk.  With second latch infant transferred more milk than first latch on right breast (6 ml first latch, 14 ml on second latch on left breast).  Lots of basic teaching done and explaining behavior of  LPTI and importance of assuring milk intake.    Tools:  None needed Instructed on use and cleaning of tool:  n/a  Pre-feed weight:  2358 g  (5 lb. 3.2 oz.) Post-feed weight:  2378 g (5 lb. 3.9 oz.) Amount transferred:  20 ml Amount supplemented:  30 ml   Total amount transferred:  20 ml Total supplement given:  30 ml  Total amount of feeding (BM and FO): 50 ml  Parents stated infant receives either breastmilk or formula after each breastfeeding if infant is still acting hungry. Supplementation amounts vary from 10-30 ml depending on amount of time infant fed from breast and how infant is acting after feeding.  Parents have been supplementing with Neosure 22 formula.   Mom states she pumps both breast with Evenflow Manual Hand Pump and is getting 60-80 ml with each pumping.    LC explained LPTI behaviors and the need to limit time at breast as infant can easily fatigue without receiving full feeding.  Lots of teaching done regarding LPTI, feeding, and amounts.  Based on 150-200 ml/kg/day amounts infant should be receiving 44-59 ml/day since infant is feeding every 3 hours (8 times per day).  LC explained amounts to parents needed for growth and explained that infant needs to received a total of 45-60 ml per feeding (8 times) per day.  Since infant fed 20 ml from breast after current feeding, parents supplemented with an additional 30 ml of EBM.  Explained to parents that as infant increases in age and approaches his due date, then he will begin to become more efficient at breasts and consume less of bottle.  Advised parents to limit time at breast to 15 minutes d/t LPTI status and explained rationale.  Written instructions given to parents, see below:    Written Instructions given to mom: (Had FOB read instructions to [facility] to make sure he could read LC's writing and for understanding) 1.  Limit breastfeeding for 15-20 min (not a full 30 minutes) for the next 3 weeks (until he is term). 2.  Give  45-60 ml (1.5-2 oz) of expressed breastmilk &/or formula after each feeding (Do not mix formula and breastmilk in same bottle). 3.  Pump after each feeding for ~20 min or until flow stops.  Get DEBP from [facility]. 4.  Put expressed breastmilk into 45-60 ml container feedings for next feeding and store. * When latching make sure mouth is wide and lips are flanged.  Follow-up Peds Appt on Wednesday, March 30, 2017 (per parents) Follow-up [facility] Appt on Thursday, April 14, 2017 @ 10:00 am

## 2017-04-11 IMAGING — US US MFM OB DETAIL+14 WK
1 series · 14 of 28 positions shown · non-contrast
Comparison: none

[Series 1: us mfm ob detail+14 wk · 71 acquisitions, 14 frames shown]
[im 3/71]
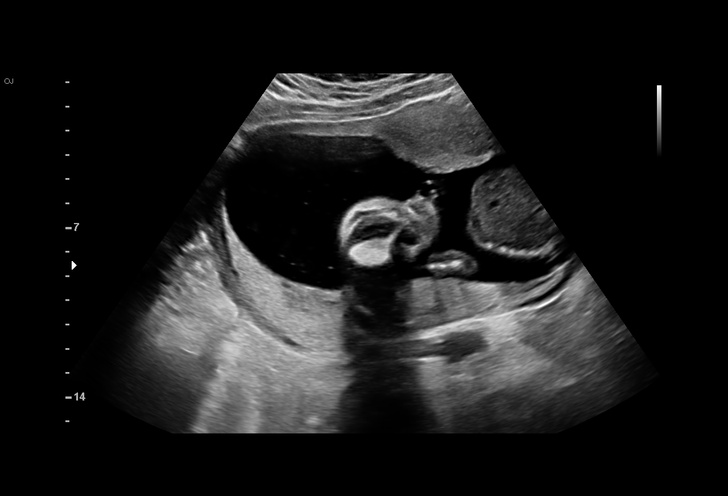
[im 8/71]
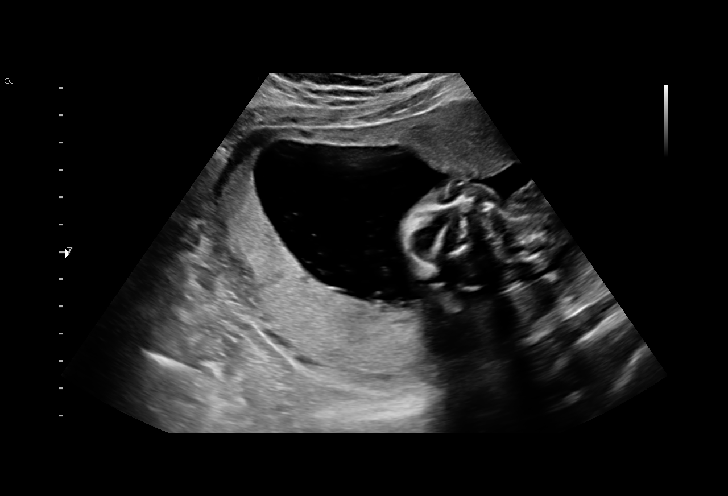
[im 13/71]
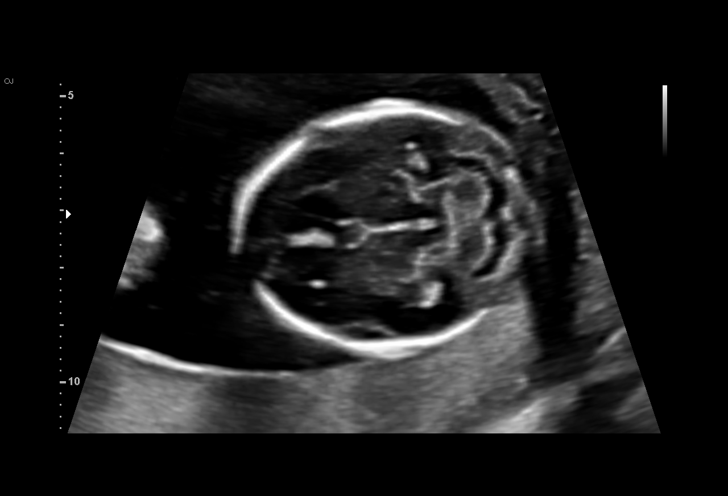
[im 19/71]
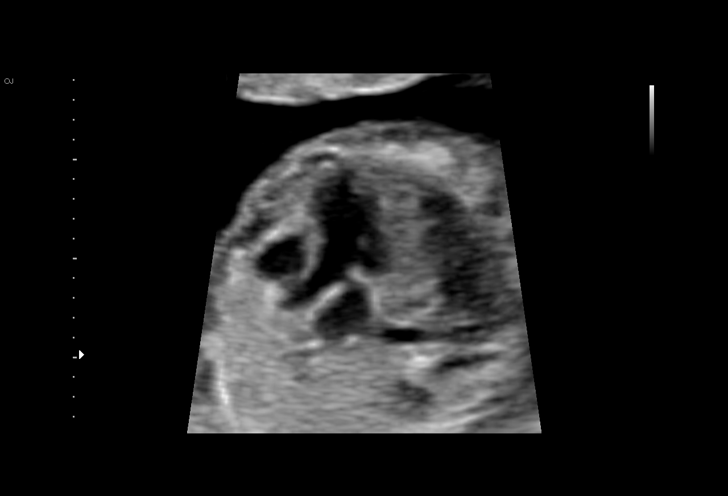
[im 24/71]
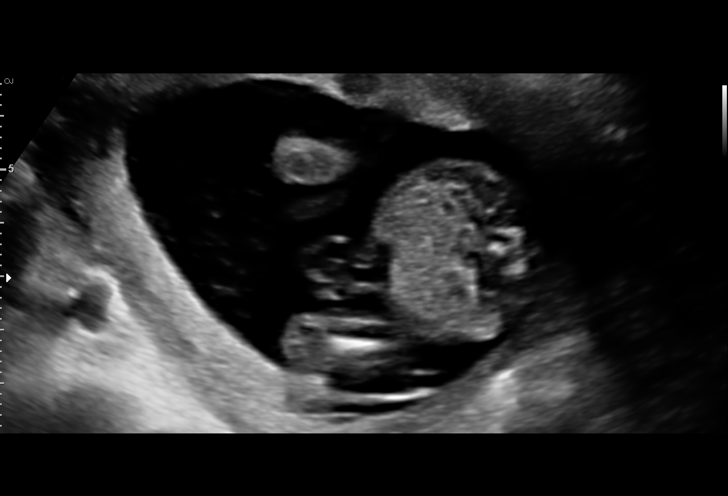
[im 29/71]
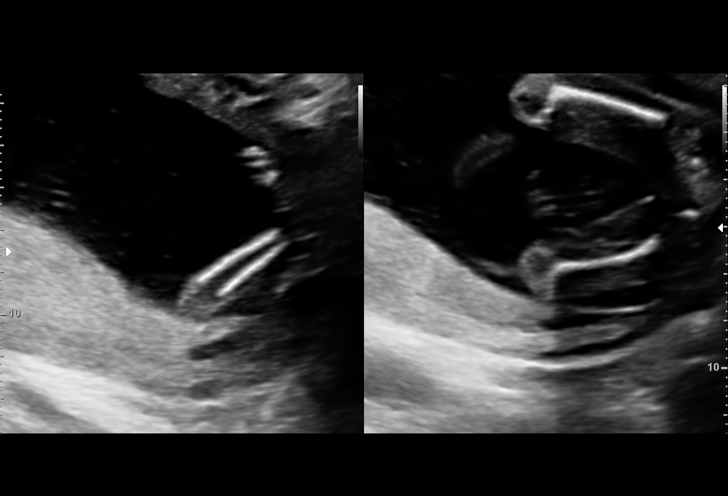
[im 34/71]
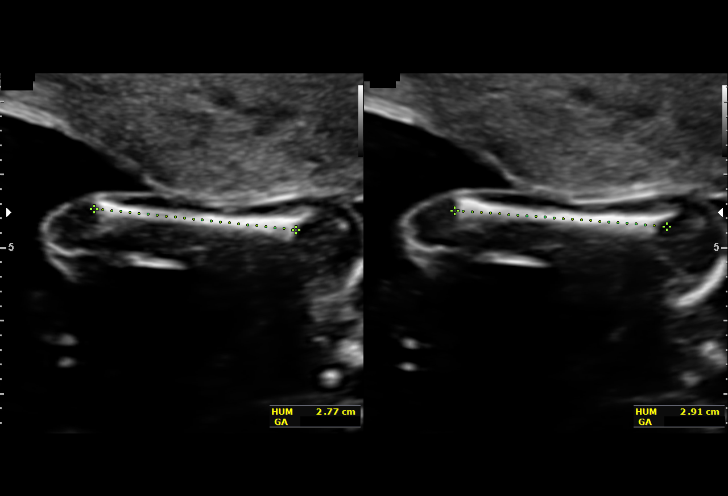
[im 39/71]
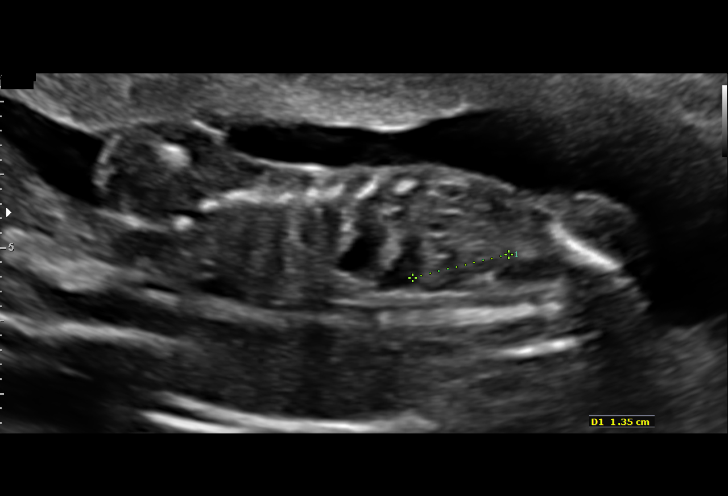
[im 45/71]
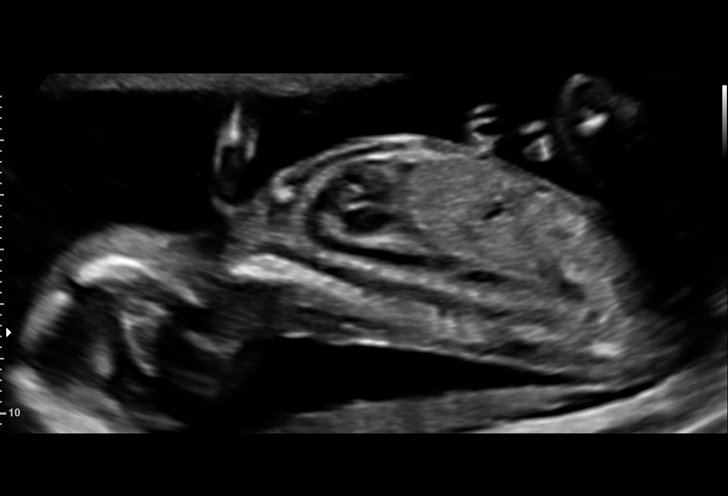
[im 50/71]
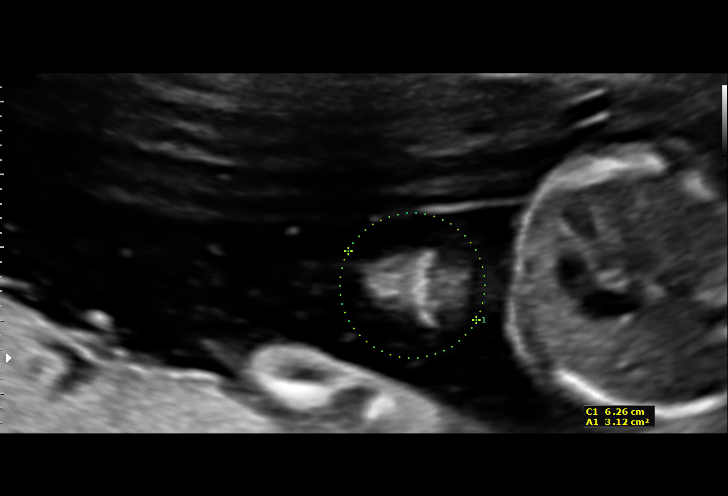
[im 55/71]
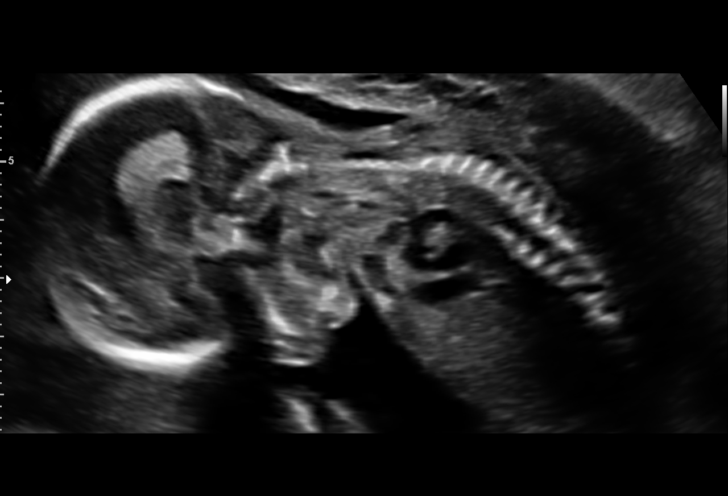
[im 60/71]
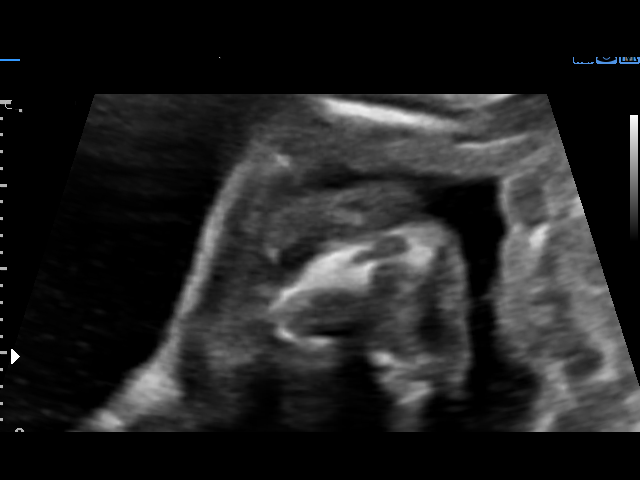
[im 65/71]
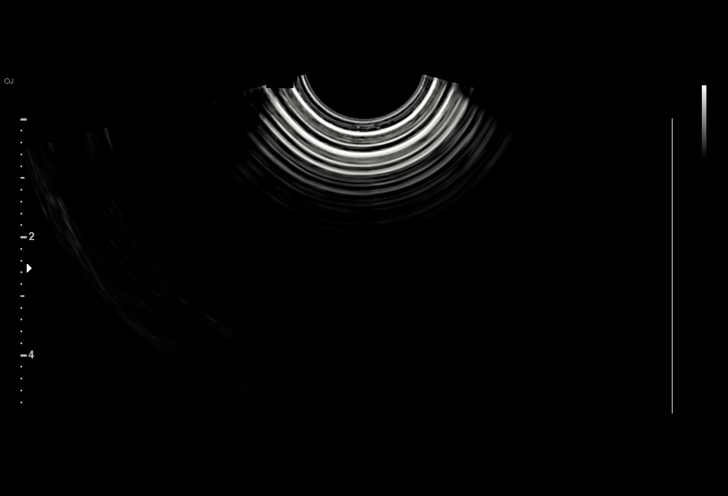
[im 71/71]
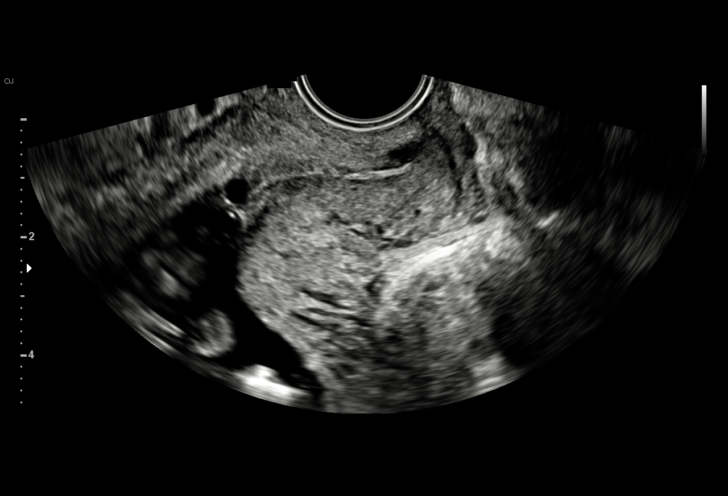

[14 of 28 positions shown; findings below may reference images not displayed]

1  DIEGUEZ METRONORTE            [id number]      [id number]     [id number]
2  DIEGUEZ METRONORTE            [id number]      [id number]     [id number]
Indications

18 weeks gestation of pregnancy
Encounter for antenatal screening for
malformations
Advanced maternal age multigravida 35+,
second trimester-low risk NIPS
Poor obstetric history: Previous preterm
delivery, antepartum (rescue cerclage, then
PROM, delivery at 23 weeks, neonatal
demise)- 17P
OB History

Gravidity:    6         Term:   1        Prem:   1        SAB:   0
TOP:          2       Ectopic:  1        Living: 1
Fetal Evaluation

Num Of Fetuses:     1
Cardiac Activity:   Observed
Presentation:       Breech
Placenta:           Posterior, above cervical os
P. Cord Insertion:  Visualized

Amniotic Fluid
AFI FV:      Subjectively within normal limits

Largest Pocket(cm)
5.2
Gestational Age

LMP:           19w 2d       Date:   07/03/16                 EDD:   04/09/17
Anatomy

Cranium:               Appears normal         Aortic Arch:            Appears normal
Cavum:                 Appears normal         Ductal Arch:            Appears normal
Ventricles:            Appears normal         Diaphragm:              Appears normal
Choroid Plexus:        Appears normal         Stomach:                Appears normal, left
sided
Cerebellum:            Appears normal         Abdomen:                Appears normal
Posterior Fossa:       Appears normal         Abdominal Wall:         Appears nml (cord
insert, abd wall)
Nuchal Fold:           Appears normal         Cord Vessels:           Appears normal (3
vessel cord)
Face:                  Appears normal         Kidneys:                Appear normal
(orbits and profile)
Lips:                  Appears normal         Bladder:                Appears normal
Thoracic:              Appears normal         Spine:                  Limited views
appear normal
Heart:                 Appears normal         Upper Extremities:      Appears normal
(4CH, axis, and
situs)
RVOT:                  Appears normal         Lower Extremities:      Appears normal
LVOT:                  Appears normal

Other:  Fetus appears to be a female. Heels and Lt 5th digit visualized.
Cervix Uterus Adnexa

Cervix
Normal appearance by transvaginal scan

Uterus
No abnormality visualized.

Left Ovary
Not visualized. No adnexal mass visualized

Right Ovary
Within normal limits.

Cul De Sac:   No free fluid seen.
Impression

Single IUP at 18w 2d
Hx of previous 23 week delivery s/p rescue cerclage
Normal fetal anatomic survey
Limited views of the fetal spine obtained
Ultrasound measurements are consistent with early
ultrasound
Normal amniotic fluid volume

TVUS - cervical length limited due to persistent lower uterine
contraction - appears normal and length (at least 3 cm)
without funneling or dynamic changes
Recommendations

Cervical length in 2 weeks
Ultrasound for growth / cervical length in 4 weeks
Continue Juan Carlo injections

## 2017-04-12 ENCOUNTER — Encounter: Payer: Self-pay | Admitting: Obstetrics and Gynecology

## 2017-04-12 ENCOUNTER — Ambulatory Visit: Payer: Medicaid Other | Admitting: Obstetrics and Gynecology

## 2017-04-12 ENCOUNTER — Ambulatory Visit (INDEPENDENT_AMBULATORY_CARE_PROVIDER_SITE_OTHER): Payer: Medicaid Other | Admitting: Obstetrics and Gynecology

## 2017-04-12 DIAGNOSIS — Z1389 Encounter for screening for other disorder: Secondary | ICD-10-CM | POA: Diagnosis not present

## 2017-04-12 NOTE — Progress Notes (Signed)
Subjective:     Heather Little is a 36 y.o. female who presents for a postpartum visit. She is 4 weeks postpartum following a spontaneous vaginal delivery. I have fully reviewed the prenatal and intrapartum course. The delivery was at 35 gestational weeks due to PPROM. Outcome: spontaneous vaginal delivery. Anesthesia: none. Postpartum course has been uncomplicated. Baby's course has been uncomplicated. Baby is feeding by both breast and bottle - Similac Advance. Bleeding no bleeding. Bowel function is normal. Bladder function is normal. Patient is not sexually active. Contraception method is abstinence. Postpartum depression screening: negative.     Review of Systems Pertinent items are noted in HPI.   Objective:    BP 118/78   Pulse 75   Ht 5\' 4"  (1.626 m)   Wt 175 lb 3.2 oz (79.5 kg)   Breastfeeding? Yes   BMI 30.07 kg/m   General:  alert, cooperative and no distress   Breasts:  inspection negative, no nipple discharge or bleeding, no masses or nodularity palpable  Lungs: clear to auscultation bilaterally  Heart:  regular rate and rhythm, S1, S2 normal, no murmur, click, rub or gallop  Abdomen: soft, non-tender; bowel sounds normal; no masses,  no organomegaly   Vulva:  normal  Vagina: normal vagina, no discharge, exudate, lesion, or erythema  Cervix:  multiparous appearance  Corpus: normal size, contour, position, consistency, mobility, non-tender  Adnexa:  normal adnexa and no mass, fullness, tenderness  Rectal Exam: Not performed.        Assessment:     Normal postpartum exam. Pap smear not done at today's visit.   Plan:    1. Contraception: none. Patient is undecided on contraception 2. Patient is medically cleared to resume all activities of daily living 3. Follow up in: 2 weeks for glucola given GDM and in  4 months for annual exam or as needed.

## 2017-04-12 NOTE — Patient Instructions (Signed)
Contraception Choices Contraception (birth control) is the use of any methods or devices to prevent pregnancy. Below are some methods to help avoid pregnancy. Hormonal methods  Contraceptive implant. This is a thin, plastic tube containing progesterone hormone. It does not contain estrogen hormone. Your health care provider inserts the tube in the inner part of the upper arm. The tube can remain in place for up to 3 years. After 3 years, the implant must be removed. The implant prevents the ovaries from releasing an egg (ovulation), thickens the cervical mucus to prevent sperm from entering the uterus, and thins the lining of the inside of the uterus.  Progesterone-only injections. These injections are given every 3 months by your health care provider to prevent pregnancy. This synthetic progesterone hormone stops the ovaries from releasing eggs. It also thickens cervical mucus and changes the uterine lining. This makes it harder for sperm to survive in the uterus.  Birth control pills. These pills contain estrogen and progesterone hormone. They work by preventing the ovaries from releasing eggs (ovulation). They also cause the cervical mucus to thicken, preventing the sperm from entering the uterus. Birth control pills are prescribed by a health care provider.Birth control pills can also be used to treat heavy periods.  Minipill. This type of birth control pill contains only the progesterone hormone. They are taken every day of each month and must be prescribed by your health care provider.  Birth control patch. The patch contains hormones similar to those in birth control pills. It must be changed once a week and is prescribed by a health care provider.  Vaginal ring. The ring contains hormones similar to those in birth control pills. It is left in the vagina for 3 weeks, removed for 1 week, and then a new one is put back in place. The patient must be comfortable inserting and removing the ring from  the vagina.A health care provider's prescription is necessary.  Emergency contraception. Emergency contraceptives prevent pregnancy after unprotected sexual intercourse. This pill can be taken right after sex or up to 5 days after unprotected sex. It is most effective the sooner you take the pills after having sexual intercourse. Most emergency contraceptive pills are available without a prescription. Check with your pharmacist. Do not use emergency contraception as your only form of birth control. Barrier methods  Female condom. This is a thin sheath (latex or rubber) that is worn over the penis during sexual intercourse. It can be used with spermicide to increase effectiveness.  Female condom. This is a soft, loose-fitting sheath that is put into the vagina before sexual intercourse.  Diaphragm. This is a soft, latex, dome-shaped barrier that must be fitted by a health care provider. It is inserted into the vagina, along with a spermicidal jelly. It is inserted before intercourse. The diaphragm should be left in the vagina for 6 to 8 hours after intercourse.  Cervical cap. This is a round, soft, latex or plastic cup that fits over the cervix and must be fitted by a health care provider. The cap can be left in place for up to 48 hours after intercourse.  Sponge. This is a soft, circular piece of polyurethane foam. The sponge has spermicide in it. It is inserted into the vagina after wetting it and before sexual intercourse.  Spermicides. These are chemicals that kill or block sperm from entering the cervix and uterus. They come in the form of creams, jellies, suppositories, foam, or tablets. They do not require a prescription. They   are inserted into the vagina with an applicator before having sexual intercourse. The process must be repeated every time you have sexual intercourse. Intrauterine contraception  Intrauterine device (IUD). This is a T-shaped device that is put in a woman's uterus during  a menstrual period to prevent pregnancy. There are 2 types: ? Copper IUD. This type of IUD is wrapped in copper wire and is placed inside the uterus. Copper makes the uterus and fallopian tubes produce a fluid that kills sperm. It can stay in place for 10 years. ? Hormone IUD. This type of IUD contains the hormone progestin (synthetic progesterone). The hormone thickens the cervical mucus and prevents sperm from entering the uterus, and it also thins the uterine lining to prevent implantation of a fertilized egg. The hormone can weaken or kill the sperm that get into the uterus. It can stay in place for 3-5 years, depending on which type of IUD is used. Permanent methods of contraception  Female tubal ligation. This is when the woman's fallopian tubes are surgically sealed, tied, or blocked to prevent the egg from traveling to the uterus.  Hysteroscopic sterilization. This involves placing a small coil or insert into each fallopian tube. Your doctor uses a technique called hysteroscopy to do the procedure. The device causes scar tissue to form. This results in permanent blockage of the fallopian tubes, so the sperm cannot fertilize the egg. It takes about 3 months after the procedure for the tubes to become blocked. You must use another form of birth control for these 3 months.  Female sterilization. This is when the female has the tubes that carry sperm tied off (vasectomy).This blocks sperm from entering the vagina during sexual intercourse. After the procedure, the man can still ejaculate fluid (semen). Natural planning methods  Natural family planning. This is not having sexual intercourse or using a barrier method (condom, diaphragm, cervical cap) on days the woman could become pregnant.  Calendar method. This is keeping track of the length of each menstrual cycle and identifying when you are fertile.  Ovulation method. This is avoiding sexual intercourse during ovulation.  Symptothermal method.  This is avoiding sexual intercourse during ovulation, using a thermometer and ovulation symptoms.  Post-ovulation method. This is timing sexual intercourse after you have ovulated. Regardless of which type or method of contraception you choose, it is important that you use condoms to protect against the transmission of sexually transmitted infections (STIs). Talk with your health care provider about which form of contraception is most appropriate for you. This information is not intended to replace advice given to you by your health care provider. Make sure you discuss any questions you have with your health care provider. Document Released: 08/23/2005 Document Revised: 01/29/2016 Document Reviewed: 02/15/2013 Elsevier Interactive Patient Education  2017 Elsevier Inc.  

## 2017-04-12 NOTE — Progress Notes (Deleted)
..  Post Partum Exam  Heather Little is a 36 y.o. [id number] female who presents for a postpartum visit. She is 4 weeks postpartum following a spontaneous vaginal delivery. I have fully reviewed the prenatal and intrapartum course. The delivery was at 35.3 gestational weeks.  Anesthesia: none. Postpartum course has been ***. Baby's course has been ***. Baby is feeding by both breast and bottle - Similac Advance. Bleeding staining only. Bowel function is normal. Bladder function is normal. Patient is not sexually active. Contraception method is none. Postpartum depression screening:neg  {Common ambulatory SmartLinks:19316}  Review of Systems {ros; complete:30496}    Objective:  unknown if currently breastfeeding.  General:  {gen appearance:16600}   Breasts:  {breast exam:1202::"inspection negative, no nipple discharge or bleeding, no masses or nodularity palpable"}  Lungs: {lung exam:16931}  Heart:  {heart exam:5510}  Abdomen: {abdomen exam:16834}   Vulva:  {labia exam:12198}  Vagina: {vagina exam:12200}  Cervix:  {cervix exam:14595}  Corpus: {uterus exam:12215}  Adnexa:  {adnexa exam:12223}  Rectal Exam: {rectal/vaginal exam:12274}        Assessment:    *** postpartum exam. Pap smear {done:10129} at today's visit.   Plan:   1. Contraception: {method:5051} 2. *** 3. Follow up in: {1-10:13787} {time; units:19136} or as needed.

## 2017-04-20 ENCOUNTER — Other Ambulatory Visit: Payer: Medicaid Other

## 2017-04-20 DIAGNOSIS — Z8632 Personal history of gestational diabetes: Secondary | ICD-10-CM

## 2017-04-21 LAB — GLUCOSE TOLERANCE, 2 HOURS W/ 1HR
Glucose, 1 hour: 135 mg/dL (ref 65–179)
Glucose, 2 hour: 120 mg/dL (ref 65–152)
Glucose, Fasting: 84 mg/dL (ref 65–91)

## 2017-05-17 ENCOUNTER — Ambulatory Visit (INDEPENDENT_AMBULATORY_CARE_PROVIDER_SITE_OTHER): Payer: Medicaid Other | Admitting: Obstetrics

## 2017-05-17 ENCOUNTER — Ambulatory Visit: Payer: Self-pay | Admitting: Certified Nurse Midwife

## 2017-05-17 ENCOUNTER — Encounter: Payer: Self-pay | Admitting: Obstetrics

## 2017-05-17 VITALS — BP 120/84 | HR 80 | Wt 181.0 lb

## 2017-05-17 DIAGNOSIS — N898 Other specified noninflammatory disorders of vagina: Secondary | ICD-10-CM | POA: Diagnosis not present

## 2017-05-17 DIAGNOSIS — R52 Pain, unspecified: Principal | ICD-10-CM

## 2017-05-17 NOTE — Progress Notes (Signed)
Patient is in the office today- she is having sharp pains I her vagina. Pain occurs more in the morning. Patient's husband states there is something there that should not be there. Patient delivered 03/15/2017- vaginal delivery. She states the pain occurred 2 weeks ago. Patient has not been sexually active since delivery- they tried- but the pain was too bad.

## 2017-05-17 NOTE — Progress Notes (Signed)
Patient ID: Heather Little, female   DOB: Dec 01, 1980, 36 y.o.   MRN: [id number]  Chief Complaint  Patient presents with  . Gynecologic Exam    HPI Heather Little is a 36 y.o. female.  Vaginal pain 6-8 weeks after NSVD with routine repair of small vaginal laceration. HPI  Past Medical History:  Diagnosis Date  . GDM (gestational diabetes mellitus)    diet controlled    Past Surgical History:  Procedure Laterality Date  . CERVICAL CERCLAGE  2017  . ECTOPIC PREGNANCY SURGERY      History reviewed. No pertinent family history.  Social History Social History  Substance Use Topics  . Smoking status: Never Smoker  . Smokeless tobacco: Never Used  . Alcohol use No    No Known Allergies  Current Outpatient Prescriptions  Medication Sig Dispense Refill  . Prenatal Vit-Fe Fumarate-FA (PRENATAL MULTIVITAMIN) TABS tablet Take 1 tablet by mouth daily at 12 noon.    Marland Kitchen. ibuprofen (ADVIL,MOTRIN) 600 MG tablet Take 1 tablet (600 mg total) by mouth every 6 (six) hours. (Patient not taking: Reported on 04/12/2017) 30 tablet 0   No current facility-administered medications for this visit.     Review of Systems Review of Systems Constitutional: negative for fatigue and weight loss Respiratory: negative for cough and wheezing Cardiovascular: negative for chest pain, fatigue and palpitations Gastrointestinal: negative for abdominal pain and change in bowel habits Genitourinary:positive for vaginal pain Integument/breast: negative for nipple discharge Musculoskeletal:negative for myalgias Neurological: negative for gait problems and tremors Behavioral/Psych: negative for abusive relationship, depression Endocrine: negative for temperature intolerance      Blood pressure 120/84, pulse 80, weight 181 lb (82.1 kg), currently breastfeeding.  Physical Exam Physical Exam           General:  Alert and no distress Abdomen:  normal findings: no organomegaly, soft, non-tender and no hernia   Pelvis:  External genitalia: normal general appearance Urinary system: urethral meatus normal and bladder without fullness, nontender Vaginal: tender indurated area palpated ~4-5 cm from introitus in vaginal floor submucosal area.  Feels like suture or other foreign body that is tender to palpation below the mucosa.  The mucosa is clean and intact.  Cervix: normal appearance Adnexa: normal bimanual exam Uterus: anteverted and non-tender, normal size    50% of 15 min visit spent on counseling and coordination of care.    Data Reviewed Delivery Note  Assessment     1. Pain related to vaginal delivery - tender palpable submucosal vaginal floor lesion possibly related to repair of vaginal laceration at delivery.  ? Stitch abscess.    Plan    Follow up in 4 weeks   No orders of the defined types were placed in this encounter.  No orders of the defined types were placed in this encounter.

## 2017-05-23 IMAGING — US US MFM OB TRANSVAGINAL
1 series · 15 of 24 positions shown · non-contrast
Comparison: none

[Series 1: us mfm ob transvaginal · 24 acquisitions, 15 frames shown]
[im 1/24]
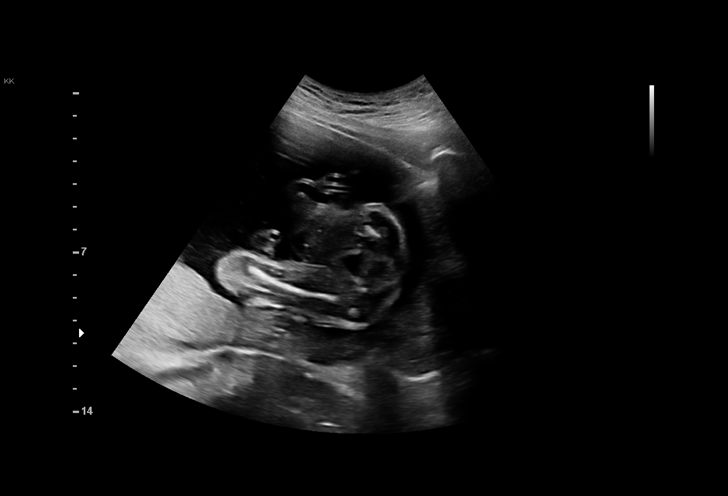
[im 3/24]
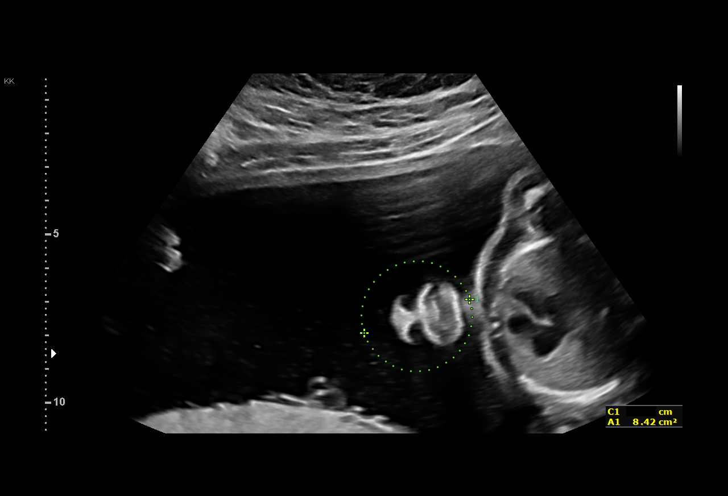
[im 5/24]
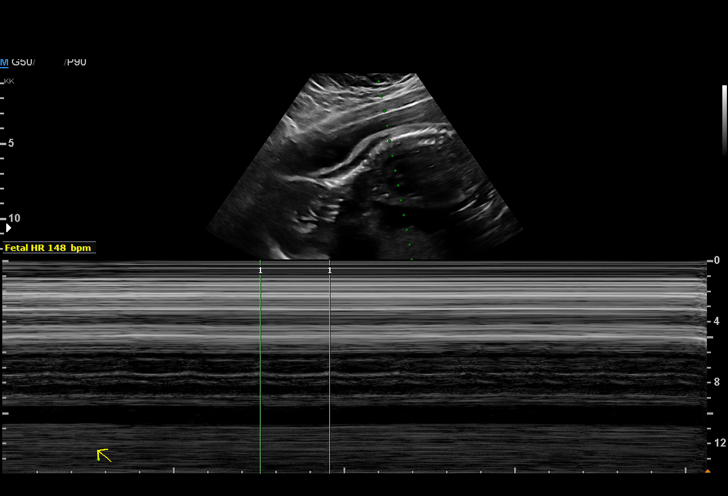
[im 6/24]
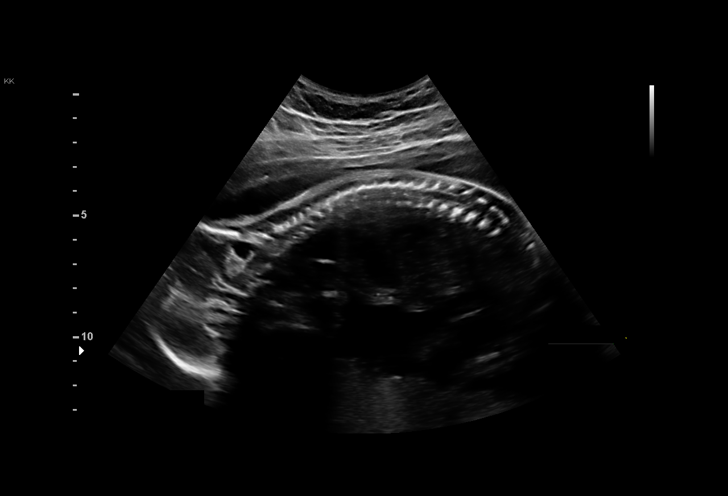
[im 8/24]
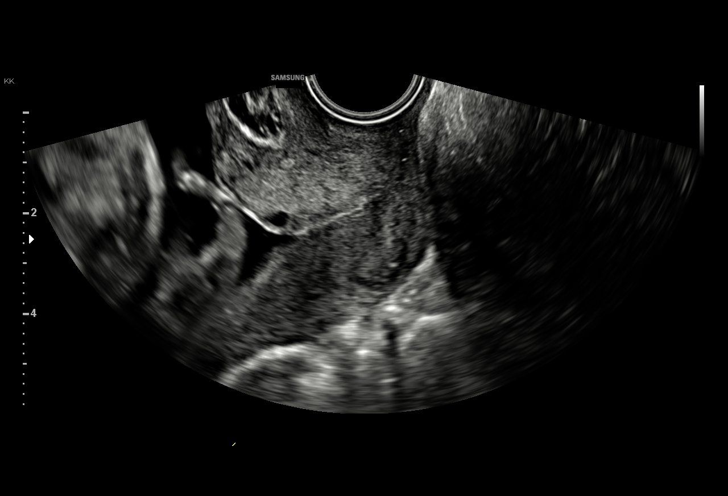
[im 9/24]
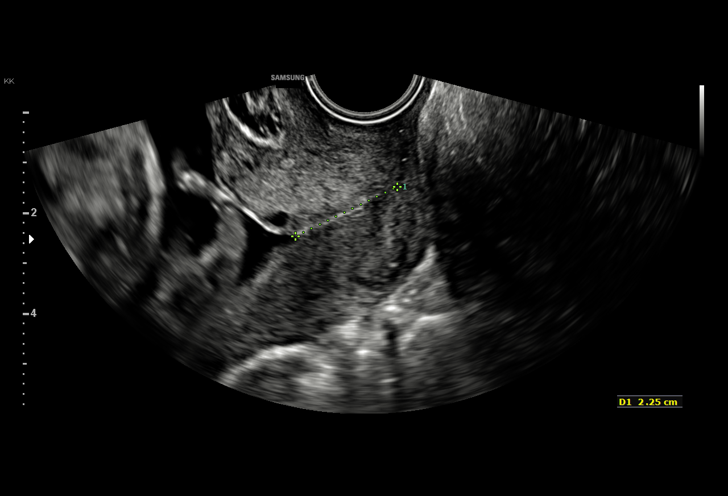
[im 11/24]
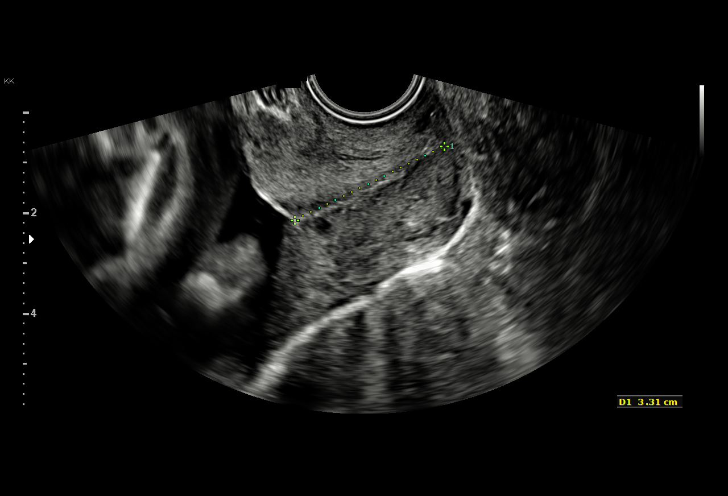
[im 13/24]
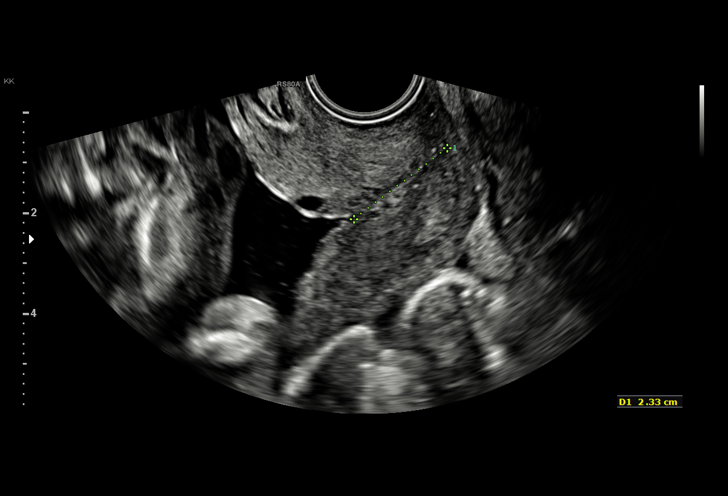
[im 14/24]
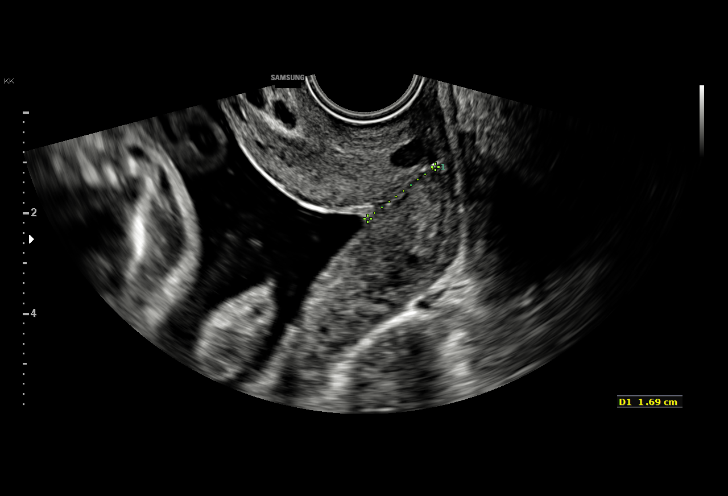
[im 16/24]
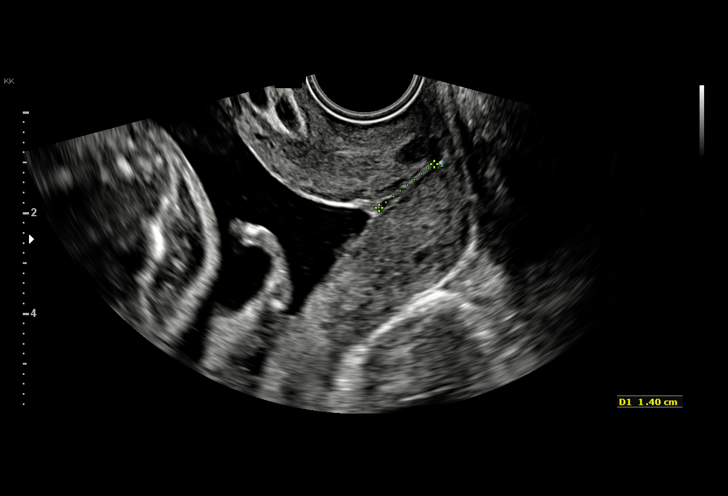
[im 17/24]
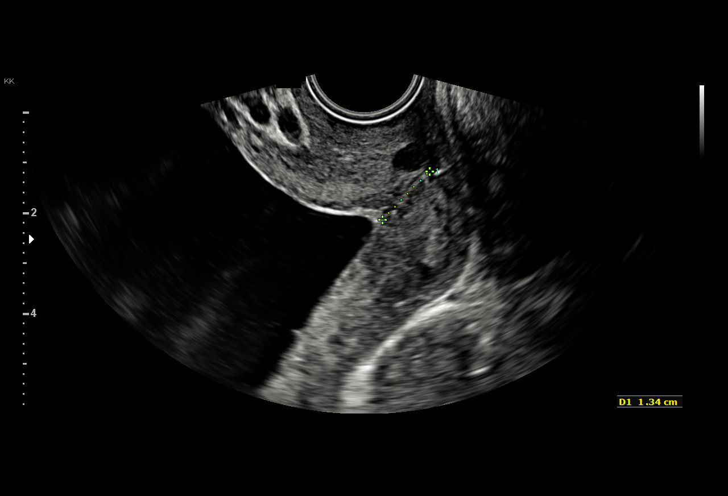
[im 19/24]
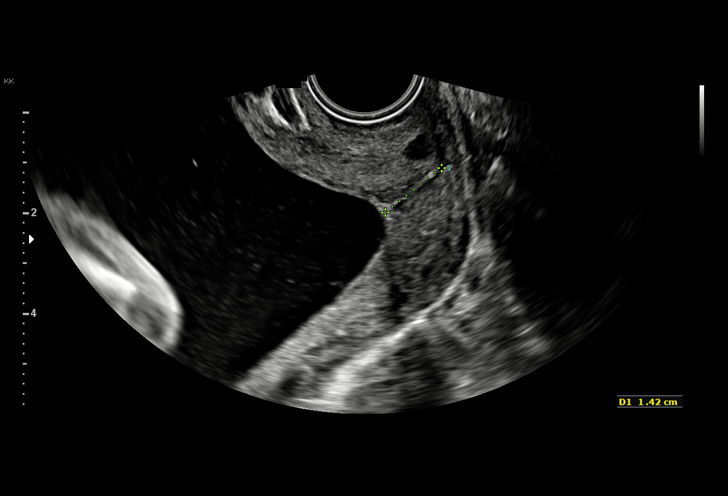
[im 21/24]
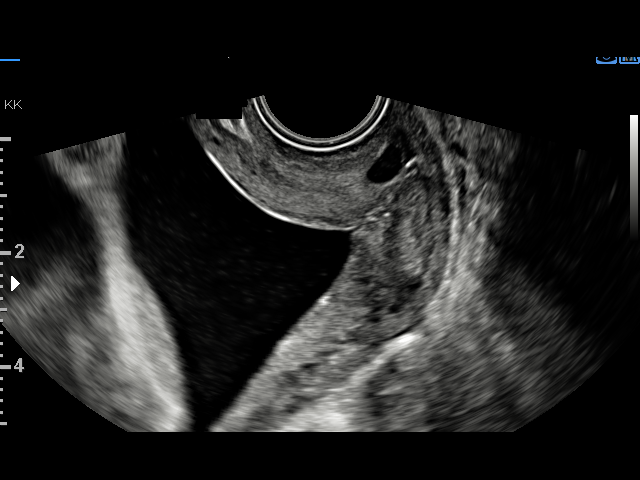
[im 22/24]
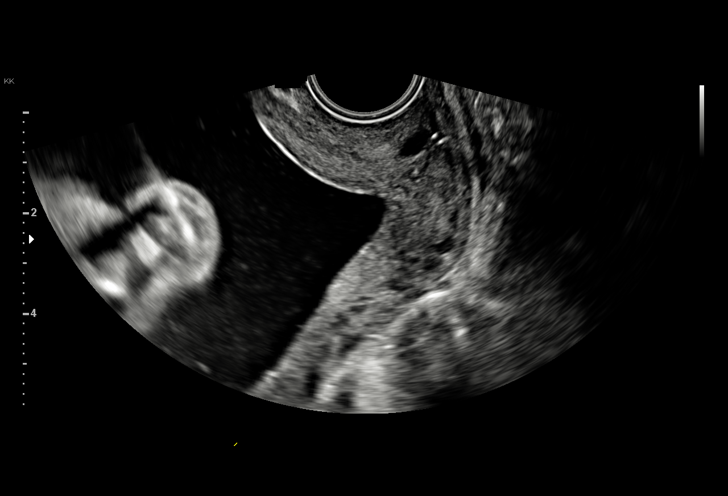
[im 24/24]
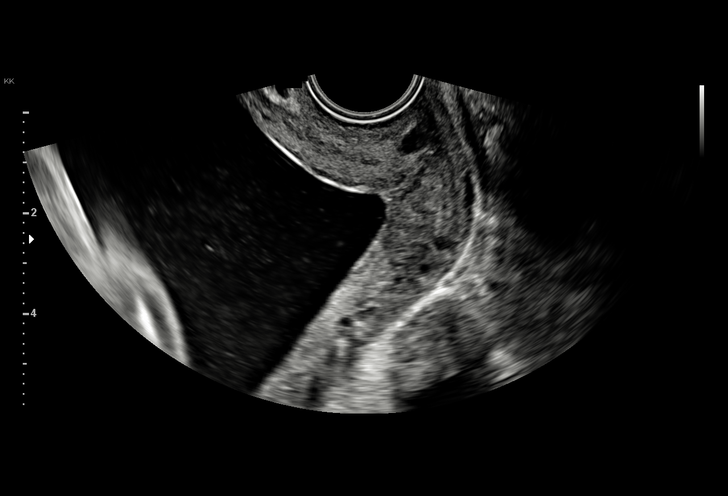

[15 of 24 positions shown; findings below may reference images not displayed]

1  JANJOUNA BATBOUT              [id number]      [id number]     [id number]
Indications

24 weeks gestation of pregnancy
Advanced maternal age multigravida 36+,
second trimester - low risk NIPS
Poor obstetric history: Previous preterm
delivery, antepartum (rescue cerclage, then
PROM, delivery at 23 weeks, neonatal
demise) - 17P
Cervical incompetence, second trimester;
declined offer for cerclage
OB History

Blood Type:            Height:  5'4"   Weight (lb):  184      BMI:
Gravidity:    6         Term:   1        Prem:   1        SAB:   0
TOP:          2       Ectopic:  1        Living: 1
Fetal Evaluation

Num Of Fetuses:     1
Fetal Heart         148
Rate(bpm):
Cardiac Activity:   Observed
Presentation:       Breech

Amniotic Fluid
AFI FV:      Subjectively within normal limits
Gestational Age

LMP:           25w 2d       Date:   07/03/16                 EDD:   04/09/17
Cervix Uterus Adnexa

Cervix
Length:            1.3  cm.
Funneling of internal os noted.
Impression

SIUP at 24+2 weeks
Normal amniotic fluid volume
EV views of cervix: very dynamic interval os; initially, Arnott
appeared to be long without funneling and measured
cms; then the internal os began to funnel, down to a length of
1.3 cms

A course of BMZ was recommended. She declined starting
the course today and finally we decided that she could call us
with the dates or talk about it next [REDACTED].
Recommendations

CL next week

## 2017-06-14 ENCOUNTER — Ambulatory Visit: Payer: Medicaid Other | Admitting: Obstetrics and Gynecology

## 2017-06-16 ENCOUNTER — Ambulatory Visit: Payer: Medicaid Other | Admitting: Obstetrics and Gynecology

## 2017-06-28 ENCOUNTER — Other Ambulatory Visit (HOSPITAL_COMMUNITY)
Admission: RE | Admit: 2017-06-28 | Discharge: 2017-06-28 | Disposition: A | Discharge status: Home / Self Care | Payer: Medicaid Other | Source: Ambulatory Visit | Attending: Obstetrics and Gynecology | Admitting: Obstetrics and Gynecology

## 2017-06-28 ENCOUNTER — Encounter: Payer: Self-pay | Admitting: Obstetrics and Gynecology

## 2017-06-28 ENCOUNTER — Ambulatory Visit (INDEPENDENT_AMBULATORY_CARE_PROVIDER_SITE_OTHER): Payer: Medicaid Other | Admitting: Obstetrics and Gynecology

## 2017-06-28 VITALS — BP 104/73 | HR 78 | Resp 16 | Wt 183.0 lb

## 2017-06-28 DIAGNOSIS — R102 Pelvic and perineal pain: Secondary | ICD-10-CM | POA: Diagnosis not present

## 2017-06-28 NOTE — Progress Notes (Signed)
36 yo [id number] s/p vaginal delivery with small laceration in July 2018 presenting with persistent vaginal pain. Patient was first evaluated for this discomfort in September. She reports some improvement in her pain since but continues to experience some dyspareunia. She denies any abnormal vaginal bleeding or discharge. She describes the pain as short lived, located near the introitus, sharp, needle-like which last a few minutes and self resolves  Past Medical History:  Diagnosis Date  . GDM (gestational diabetes mellitus)    diet controlled   Past Surgical History:  Procedure Laterality Date  . CERVICAL CERCLAGE  2017  . ECTOPIC PREGNANCY SURGERY     No family history on file. Social History  Substance Use Topics  . Smoking status: Never Smoker  . Smokeless tobacco: Never Used  . Alcohol use No   ROS See pertinent in HPI  Blood pressure 104/73, pulse 78, resp. rate 16, weight 183 lb (83 kg), currently breastfeeding. GENERAL: Well-developed, well-nourished female in no acute distress.  ABDOMEN: Soft, nontender, nondistended.  PELVIC: Normal external female genitalia. Vagina is pink and rugated. Point tenderness located on the posterior aspect of the vagina, 2 cm pass the introitus. No abnormalities visualized or palpated. No suture material or scar tissue appreciated.  Normal discharge. Normal appearing cervix. Uterus is normal in size. No adnexal mass or tenderness. EXTREMITIES: No cyanosis, clubbing, or edema, 2+ distal pulses.  A/P 36 yo with vaginal pain s/p vaginal delivery in July - Wet prep and cultures collected - patient will be contacted with results - If results are negative for infection or symptoms persist following treatment, patient will need to be referred to physical therapy - RTC prn

## 2017-06-29 LAB — CERVICOVAGINAL ANCILLARY ONLY
BACTERIAL VAGINITIS: NEGATIVE
CANDIDA VAGINITIS: NEGATIVE
Chlamydia: NEGATIVE
NEISSERIA GONORRHEA: NEGATIVE
Trichomonas: NEGATIVE

## 2018-01-24 ENCOUNTER — Ambulatory Visit (INDEPENDENT_AMBULATORY_CARE_PROVIDER_SITE_OTHER): Payer: Managed Care, Other (non HMO)

## 2018-01-24 ENCOUNTER — Other Ambulatory Visit: Payer: Self-pay | Admitting: Sports Medicine

## 2018-01-24 ENCOUNTER — Encounter: Payer: Self-pay | Admitting: Sports Medicine

## 2018-01-24 ENCOUNTER — Ambulatory Visit (INDEPENDENT_AMBULATORY_CARE_PROVIDER_SITE_OTHER): Payer: Managed Care, Other (non HMO) | Admitting: Sports Medicine

## 2018-01-24 VITALS — BP 127/77 | HR 76 | Resp 16

## 2018-01-24 DIAGNOSIS — S99922A Unspecified injury of left foot, initial encounter: Secondary | ICD-10-CM | POA: Diagnosis not present

## 2018-01-24 DIAGNOSIS — S92502A Displaced unspecified fracture of left lesser toe(s), initial encounter for closed fracture: Secondary | ICD-10-CM

## 2018-01-24 DIAGNOSIS — O4100X0 Oligohydramnios, unspecified trimester, not applicable or unspecified: Secondary | ICD-10-CM | POA: Insufficient documentation

## 2018-01-24 DIAGNOSIS — M79672 Pain in left foot: Secondary | ICD-10-CM | POA: Diagnosis not present

## 2018-01-24 NOTE — Progress Notes (Signed)
Subjective: Heather Little is a 37 y.o. female patient who presents to office for evaluation of Left foot pain. Patient complains of progressive pain at left 4th toe. Admits stub injury that happened on Sunday, Pain 6/10. Patient has tried rest and elevation with no relief in symptoms. Patient denies any other pedal complaints.   Review of Systems  Musculoskeletal:       Left toe pain  All other systems reviewed and are negative.    Patient Active Problem List   Diagnosis Date Noted  . Anhydramnios 01/24/2018  . Gestational diabetes mellitus (GDM), antepartum 02/07/2017  . Short interval between pregnancies affecting pregnancy, antepartum 12/27/2016  . Encounter for supervision of high-risk pregnancy with elderly multigravida 11/01/2016  . History of premature delivery 10/06/2016  . History of premature rupture of membranes 10/06/2016  . Incompetency, cervical 10/06/2016  . 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 Beta thalassemia trait 12/28/2015  . McDonald cerclage present in second trimester 12/23/2015    Current Outpatient Medications on File Prior to Visit  Medication Sig Dispense Refill  . ibuprofen (ADVIL,MOTRIN) 600 MG tablet Take 1 tablet (600 mg total) by mouth every 6 (six) hours. 30 tablet 0  . Prenatal Vit-Fe Fumarate-FA (PRENATAL MULTIVITAMIN) TABS tablet Take 1 tablet by mouth daily at 12 noon.     No current facility-administered medications on file prior to visit.     No Known Allergies  Objective:  General: Alert and oriented x3 in no acute distress  Dermatology: No open lesions bilateral lower extremities, no webspace macerations, + Ecchymosis Left 4th toe, all nails x 10 are well manicured.  Vascular: Focal edema noted to left foot at 4th toe. Dorsalis Pedis and Posterior Tibial pedal pulses 2/4, Capillary Fill Time 3 seconds,(+) pedal hair growth bilateral, Temperature gradient within normal limits.  Neurology: Gross sensation intact via light touch  bilateral, Protective sensation intact  with Semmes Weinstein Monofilament to all pedal sites, Deep tendon reflexes within normal limits bilateral, No babinski sign present bilateral. (- )Tinels sign left foot.   Musculoskeletal: There is tenderness with palpation at left 4th toe, No pain with calf compression bilateral. All joint range of motion is within normal limits except at left 4th toe, Strength within normal limits in all groups bilateral.   Gait: Unassisted, Antalgic gait  Xrays  Left Foot   Impression: Normal osseous mineralization, mild soft tissue swelling, + fracture at phalanx of 4th toe. No other  Acute findings.     Assessment and Plan: Problem List Items Addressed This Visit    None    Visit Diagnoses    Toe injury, left, initial encounter    -  Primary   Relevant Orders   DG Foot Complete Left   Stress fracture of phalanx of toe of left foot, initial encounter       Left foot pain           -Complete examination performed -Xrays reviewed -Discussed treatement options for fracture; risks, alternatives, and benefits explained. -Applied coban Toe splint  -Dispensed post op shoe for patient to wear at all times and instructed on use -Recommend protection, rest, ice, elevation daily until symptoms improve -Can not give pain meds due to patient currently breastfeeding  -Patient to return to office in 4-5 weeks for serial x-rays to assess healing  or sooner if condition worsens.  Asencion Islam, DPM

## 2018-02-28 ENCOUNTER — Ambulatory Visit (INDEPENDENT_AMBULATORY_CARE_PROVIDER_SITE_OTHER): Payer: Managed Care, Other (non HMO)

## 2018-02-28 ENCOUNTER — Ambulatory Visit (INDEPENDENT_AMBULATORY_CARE_PROVIDER_SITE_OTHER): Payer: Managed Care, Other (non HMO) | Admitting: Sports Medicine

## 2018-02-28 ENCOUNTER — Encounter: Payer: Self-pay | Admitting: Sports Medicine

## 2018-02-28 DIAGNOSIS — M79672 Pain in left foot: Secondary | ICD-10-CM | POA: Diagnosis not present

## 2018-02-28 DIAGNOSIS — S92502D Displaced unspecified fracture of left lesser toe(s), subsequent encounter for fracture with routine healing: Secondary | ICD-10-CM

## 2018-02-28 DIAGNOSIS — S99922D Unspecified injury of left foot, subsequent encounter: Secondary | ICD-10-CM | POA: Diagnosis not present

## 2018-02-28 NOTE — Progress Notes (Signed)
Subjective: Heather Little is a 37 y.o. female patient who returns to office for evaluation of Left foot pain. Patient states that pain is  better at the 4th toe. Patient has been strapping toe as instructed, resting, icing, and using post op shoe. Patient admits to 1 episode of blister that went away. Denies warmth, drainage, or any other constitutional symptoms. Patient denies any other pedal complaints.    Patient Active Problem List   Diagnosis Date Noted  . Anhydramnios 01/24/2018  . Gestational diabetes mellitus (GDM), antepartum 02/07/2017  . Short interval between pregnancies affecting pregnancy, antepartum 12/27/2016  . Encounter for supervision of high-risk pregnancy with elderly multigravida 11/01/2016  . History of premature delivery 10/06/2016  . History of premature rupture of membranes 10/06/2016  . Incompetency, cervical 10/06/2016  . 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 Beta thalassemia trait 12/28/2015  . McDonald cerclage present in second trimester 12/23/2015    Current Outpatient Medications on File Prior to Visit  Medication Sig Dispense Refill  . ibuprofen (ADVIL,MOTRIN) 600 MG tablet Take 1 tablet (600 mg total) by mouth every 6 (six) hours. 30 tablet 0  . Prenatal Vit-Fe Fumarate-FA (PRENATAL MULTIVITAMIN) TABS tablet Take 1 tablet by mouth daily at 12 noon.     No current facility-administered medications on file prior to visit.     No Known Allergies  Objective:  General: Alert and oriented x3 in no acute distress  Dermatology: No open lesions bilateral lower extremities, no webspace macerations, + Decreased ecchymosis Left 4th toe, all nails x 10 are well manicured.  Vascular: Decreased focal edema noted to left foot at 4th toe. Dorsalis Pedis and Posterior Tibial pedal pulses 2/4, Capillary Fill Time 3 seconds,(+) pedal hair growth bilateral, Temperature gradient within normal limits.  Neurology: Gross sensation intact via light touch  bilateral, Protective sensation intact  with Semmes Weinstein Monofilament to all pedal sites, Deep tendon reflexes within normal limits bilateral, No babinski sign present bilateral. (- )Tinels sign left foot.   Musculoskeletal: There is mild tenderness with palpation at left 4th toe, No pain with calf compression bilateral. All joint range of motion is within normal limits except at left 4th toe, Strength within normal limits in all groups bilateral.   Xrays  Left Foot   Impression: Normal osseous mineralization, mild soft tissue swelling, + fracture at phalanx of 4th toe. No other  Acute findings.     Assessment and Plan: Problem List Items Addressed This Visit    None    Visit Diagnoses    Closed fracture of phalanx of left fourth toe with routine healing, subsequent encounter    -  Primary   Relevant Orders   DG Foot Complete Left   Toe injury, left, subsequent encounter       Left foot pain          -Complete examination performed -Xrays reviewed -Discussed continued care for fracture; risks, alternatives, and benefits explained. -Continue with coban Toe splint as needed if toe has swelling  -Continue with post op shoe for patient to wear at all times  -Recommend protection, rest, ice, elevation daily until symptoms improve  -Patient to return to office in 5 weeks for final serial x-rays to assess healing of toe fracture or sooner if condition worsens.  Asencion Islamitorya Fabian Coca, DPM

## 2018-04-04 ENCOUNTER — Encounter: Payer: Self-pay | Admitting: Sports Medicine

## 2018-04-04 ENCOUNTER — Ambulatory Visit (INDEPENDENT_AMBULATORY_CARE_PROVIDER_SITE_OTHER): Payer: Managed Care, Other (non HMO) | Admitting: Sports Medicine

## 2018-04-04 ENCOUNTER — Ambulatory Visit (INDEPENDENT_AMBULATORY_CARE_PROVIDER_SITE_OTHER): Payer: Managed Care, Other (non HMO)

## 2018-04-04 DIAGNOSIS — S92502D Displaced unspecified fracture of left lesser toe(s), subsequent encounter for fracture with routine healing: Secondary | ICD-10-CM

## 2018-04-04 DIAGNOSIS — M79672 Pain in left foot: Secondary | ICD-10-CM | POA: Diagnosis not present

## 2018-04-04 DIAGNOSIS — S99922D Unspecified injury of left foot, subsequent encounter: Secondary | ICD-10-CM | POA: Diagnosis not present

## 2018-04-04 NOTE — Progress Notes (Signed)
Subjective: Heather Little is a 37 y.o. female patient who returns to office for evaluation of Left foot pain. Patient states that there is no pain at 4th toe. Patient has been strapping toe as instructed, resting, icing, and using post op shoe. Denies warmth, drainage, or any other constitutional symptoms. Patient denies any other pedal complaints.    Patient Active Problem List   Diagnosis Date Noted  . Anhydramnios 01/24/2018  . Gestational diabetes mellitus (GDM), antepartum 02/07/2017  . Short interval between pregnancies affecting pregnancy, antepartum 12/27/2016  . Encounter for supervision of high-risk pregnancy with elderly multigravida 11/01/2016  . History of premature delivery 10/06/2016  . History of premature rupture of membranes 10/06/2016  . Incompetency, cervical 10/06/2016  . 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 Beta thalassemia trait 12/28/2015  . McDonald cerclage present in second trimester 12/23/2015    Current Outpatient Medications on File Prior to Visit  Medication Sig Dispense Refill  . ibuprofen (ADVIL,MOTRIN) 600 MG tablet Take 1 tablet (600 mg total) by mouth every 6 (six) hours. 30 tablet 0  . Prenatal Vit-Fe Fumarate-FA (PRENATAL MULTIVITAMIN) TABS tablet Take 1 tablet by mouth daily at 12 noon.     No current facility-administered medications on file prior to visit.     No Known Allergies  Objective:  General: Alert and oriented x3 in no acute distress  Dermatology: No open lesions bilateral lower extremities, no webspace macerations, + Decreased ecchymosis Left 4th toe, all nails x 10 are well manicured.  Vascular: Decreased focal edema noted to left foot at 4th toe. Dorsalis Pedis and Posterior Tibial pedal pulses 2/4, Capillary Fill Time 3 seconds,(+) pedal hair growth bilateral, Temperature gradient within normal limits.  Neurology: Gross sensation intact via light touch bilateral, Protective sensation intact  with Semmes Weinstein  Monofilament to all pedal sites, Deep tendon reflexes within normal limits bilateral, No babinski sign present bilateral. (- )Tinels sign left foot.   Musculoskeletal: There is resolved tenderness with palpation at left 4th toe, No pain with calf compression bilateral. All joint range of motion is within normal limits except at left 4th toe, Strength within normal limits in all groups bilateral.   Xrays  Left Foot   Impression: Normal osseous mineralization, mild soft tissue swelling, + fracture at phalanx of 4th toe that is NOW prematurely healed. No other  Acute findings.     Assessment and Plan: Problem List Items Addressed This Visit    None    Visit Diagnoses    Closed fracture of phalanx of left fourth toe with routine healing, subsequent encounter    -  Primary   Relevant Orders   DG Foot Complete Left   Toe injury, left, subsequent encounter       Left foot pain          -Complete examination performed -Xrays reviewed -Discussed continued care for prematurely healed fracture; risks, alternatives, and benefits explained. -May d/c toe splinting -May resume normal shoe  -Recommend protection, rest, ice, elevation as needed for swelling  -Patient to return to office as needed or sooner if condition worsens.  Asencion Islamitorya Chidi Shirer, DPM
